# Patient Record
Sex: Male | Born: 1965 | Hispanic: Yes | Marital: Single | Smoking: Never smoker
Health system: Southern US, Community
[De-identification: ages and names within clinical notes are randomized; demographics above are authoritative.]

---

## 2010-03-20 ENCOUNTER — Emergency Department (HOSPITAL_COMMUNITY): Admission: EM | Admit: 2010-03-20 | Discharge: 2010-03-21 | Payer: Self-pay | Admitting: Emergency Medicine

## 2014-09-30 ENCOUNTER — Emergency Department (HOSPITAL_COMMUNITY)
Admission: EM | Admit: 2014-09-30 | Discharge: 2014-10-01 | Disposition: A | Discharge status: Home / Self Care | Payer: Worker's Compensation | Attending: Emergency Medicine | Admitting: Emergency Medicine

## 2014-09-30 ENCOUNTER — Encounter (HOSPITAL_COMMUNITY): Payer: Self-pay | Admitting: Emergency Medicine

## 2014-09-30 DIAGNOSIS — W260XXA Contact with knife, initial encounter: Secondary | ICD-10-CM | POA: Diagnosis not present

## 2014-09-30 DIAGNOSIS — Y998 Other external cause status: Secondary | ICD-10-CM | POA: Insufficient documentation

## 2014-09-30 DIAGNOSIS — Z880 Allergy status to penicillin: Secondary | ICD-10-CM | POA: Diagnosis not present

## 2014-09-30 DIAGNOSIS — Y929 Unspecified place or not applicable: Secondary | ICD-10-CM | POA: Insufficient documentation

## 2014-09-30 DIAGNOSIS — Y9389 Activity, other specified: Secondary | ICD-10-CM | POA: Insufficient documentation

## 2014-09-30 DIAGNOSIS — S61309A Unspecified open wound of unspecified finger with damage to nail, initial encounter: Secondary | ICD-10-CM

## 2014-09-30 DIAGNOSIS — S61223A Laceration with foreign body of left middle finger without damage to nail, initial encounter: Secondary | ICD-10-CM | POA: Diagnosis present

## 2014-09-30 DIAGNOSIS — S61209A Unspecified open wound of unspecified finger without damage to nail, initial encounter: Secondary | ICD-10-CM

## 2014-09-30 DIAGNOSIS — Z791 Long term (current) use of non-steroidal anti-inflammatories (NSAID): Secondary | ICD-10-CM | POA: Diagnosis not present

## 2014-10-01 MED ORDER — ACETAMINOPHEN 500 MG PO TABS
1000.0000 mg | ORAL_TABLET | Freq: Once | ORAL | Status: AC
  Administered 2014-10-01: 1000 mg via ORAL

## 2014-10-01 MED ORDER — TETANUS-DIPHTH-ACELL PERTUSSIS 5-2.5-18.5 LF-MCG/0.5 IM SUSP
0.5000 mL | Freq: Once | INTRAMUSCULAR | Status: AC
  Administered 2014-10-01: 0.5 mL via INTRAMUSCULAR

## 2014-10-01 NOTE — ED Provider Notes (Signed)
CSN: [id number]     Arrival date & time 09/30/14  2058 History   First MD Initiated Contact with Patient 10/01/14 [phone]     Chief Complaint  Patient presents with  . Laceration     (Consider location/radiation/quality/duration/timing/severity/associated sxs/prior Treatment) HPI Patient is a 48 year old male with no significant past medical history who presents the ER with a injury to his finger. Patient states he was cooking using a knife, and accidentally cut his middle finger on his left hand. Patient reports persistent bleeding, and mild pain. Patient denies numbness, tingling, loss of sensation, weakness.  History reviewed. No pertinent past medical history. History reviewed. No pertinent past surgical history. No family history on file. History  Substance Use Topics  . Smoking status: Not on file  . Smokeless tobacco: Not on file  . Alcohol Use: Not on file    Review of Systems  Skin: Positive for wound.  Neurological: Negative for numbness.      Allergies  Penicillins  Home Medications   Prior to Admission medications   Medication Sig Start Date End Date Taking? Authorizing Provider  naproxen sodium (ANAPROX) 220 MG tablet Take 220 mg by mouth 2 (two) times daily as needed (pain).   Yes Historical Provider, MD   BP 119/92 mmHg  Pulse 64  Temp(Src) 98.7 F (37.1 C) (Oral)  Resp 14  SpO2 99% Physical Exam  Constitutional: He is oriented to person, place, and time. He appears well-developed and well-nourished. No distress.  HENT:  Head: Normocephalic and atraumatic.  Eyes: Right eye exhibits no discharge. Left eye exhibits no discharge. No scleral icterus.  Neck: Normal range of motion.  Pulmonary/Chest: Effort normal. No respiratory distress.  Musculoskeletal: Normal range of motion.  Neurological: He is alert and oriented to person, place, and time.  Skin: Skin is warm and dry. He is not diaphoretic.  Avulsion noted to the distal, radial one fourth of  patient's nail on the middle finger of his left hand (please see picture). Nail matrix intact, lunula and nailbed at borders of the nail intact. Finger pad intact.  Psychiatric: He has a normal mood and affect.  Nursing note and vitals reviewed.   ED Course  Procedures (including critical care time) Labs Review Labs Reviewed - No data to display  Patient was cooking and he was using a knife and accidentally cut his left middle finger.  Patient has good range of motion of his finger. He has a total evulsion of part of his fingernail with active bleeding. Please see photo      Medical screening examination/treatment/procedure(s) were conducted as a shared visit with non-physician practitioner(s) and myself. I personally evaluated the patient during the encounter.   EKG Interpretation None     Devoria AlbeIva Knapp, MD, Armando GangFACEP   Ward GivensIva L Knapp, MD 10/01/14 0216  Imaging Review No results found.   EKG Interpretation None      MDM   Final diagnoses:  Avulsion of finger, initial encounter  Fingernail avulsion, initial encounter   Patient here with avulsion to nail and nailbed of middle finger of left hand. Avulsed tissue is off, and wound is not amenable to laceration or repair. Hemostatic dressing placed on wound, which slowed the bleeding of the avulsion. Wound was cleaned and dressed with hemostatic dressing. I discussed return precautions with patient and encouraged him to follow-up with a primary care physician. Tetanus booster given. I encouraged patient to call or return to the ER should he have any questions or concerns.  BP 119/92 mmHg  Pulse 64  Temp(Src) 98.7 F (37.1 C) (Oral)  Resp 14  SpO2 99%  Signed,  Ladona MowJoe Kendry Pfarr, PA-C 8:42 AM     Monte FantasiaJoseph W Lainey Nelson, PA-C 10/01/14 [id number]  Ward GivensIva L Knapp, MD 11/15/14 [phone]

## 2014-10-01 NOTE — Discharge Instructions (Signed)
Nail Avulsion Injury Nail avulsion means that you have lost the whole, or part of a nail. The nail will usually grow back in 2 to 6 months. If your injury damaged the growth center of the nail, the nail may be deformed, split, or not stuck to the nail bed. Sometimes the avulsed nail is stitched back in place. This provides temporary protection to the nail bed until the new nail grows in.  HOME CARE INSTRUCTIONS   Raise (elevate) your injury as much as possible.  Protect the injury and cover it with bandages (dressings) or splints as instructed.  Change dressings as instructed. SEEK MEDICAL CARE IF:   There is increasing pain, redness, or swelling.  You cannot move your fingers or toes. Document Released: 11/21/2004 Document Revised: 01/06/2012 Document Reviewed: 09/15/2009 [facility] Patient Information 2015 [place], [place]. This information is not intended to replace advice given to you by your health care provider. Make sure you discuss any questions you have with your health care provider.  Deep Skin Avulsion A deep skin avulsion is when all layers of the skin or parts of body structures have been torn away. This is usually a result of severe injury (trauma). A deep skin avulsion can include damage to important structures beneath the skin such as tendons, ligaments, nerves, or blood vessels.  CAUSES  Many injuries can lead to a deep skin avulsion. These include:   Crush injuries.  Bites.  Falls against jagged surfaces.  Gunshot wounds.  Severe burns and injuries involving dragging (such as those from a bicycle or motorcycle accident). TREATMENT   If the wound is small and there is no damage to vital structures like nerves and blood vessels, the damaged tissues may be removed. Then, the wound can be cleaned thoroughly and closed.  A skin graft may be performed. This is a procedure in which the outer layer of skin is removed from a different part of your body. That skin (skin graft)  is used to cover the open wound. This can happen after damaged tissue is removed and repairs are completed.  Your caregiver may onlyapply a bandage (dressing) to the wound. The wound will be kept clean and allowed to heal. Healing can take weeks or months and usually leaves a large scar. This type of treatment is only done if your caregiver feels that skin grafting or a similar procedure would not work. You might need a tetanus shot if:  You cannot remember when you had your last tetanus shot.  You have never had a tetanus shot.  The injury broke your skin. If you got a tetanus shot, your arm may swell, get red, and feel warm to the touch. This is common and not a problem. If you need a tetanus shot and you choose not to have one, there is a rare chance of getting tetanus. Sickness from tetanus can be serious. HOME CARE INSTRUCTIONS   Only take over-the-counter or prescription medicines for pain, discomfort, or fever as directed by your caregiver.  Gently wash the area with mild soap and water 2 times a day, or as directed. Rinse off the soap. Pat the area dry with a clean towel. Do not rub the wound. This may cause bleeding.  Follow your caregiver's instructions for how often you need to change the dressing.  Apply ointment and a dressing to the wound as directed.  If the dressing sticks, moisten it with soapy water and gently remove it.  Change the bandage right away if  it becomes wet, dirty, or starts to smell bad.  Take showers. Do not take tub baths, swim, or do anything that may soak the wound until it is healed.  Use anti-itch medicine as directed by your caregiver. The wound may itch when it is healing. Do not pick or scratch at the wound.  Follow up with your caregiver for stitches (sutures), staple, or skin adhesive strip removal. SEEK MEDICAL CARE IF:   You have redness, swelling, or increasing pain in your wound.  A red streak or line extends away from the wound.  You  have pus coming from the wound.  You notice a bad smell coming from thewound or dressing.  The wound breaks open (edges not staying together) after sutures have been removed.  You notice something coming out of the wound, such as a small piece of wood, glass, or metal.  You are unable to properly move a finger or toe if the wound is on your hand or foot.  You have severe swelling around the wound that causes pain and numbness.  Your arm, hand, leg, or foot changes color. SEEK IMMEDIATE MEDICAL CARE IF:   Your pain becomes severe or is not adequately relieved with pain medicine.  You have a fever.  You have nausea and vomiting for more than 24 hours.  You feel lightheaded, weak, or faint.  You develop chest pain or difficulty breathing. MAKE SURE YOU:   Understand these instructions.  Will watch your condition.  Will get help right away if you are not doing well or get worse. Document Released: 12/10/2006 Document Revised: 01/06/2012 Document Reviewed: 02/17/2011 [facility] Patient Information 2015 [place], [place]. This information is not intended to replace advice given to you by your health care provider. Make sure you discuss any questions you have with your health care provider.  Lesin de la ua por avulsin (Nail Avulsion Injury) Usted ha perdido una ua de la mano o del pie. Normalmente, la ua vuelve a crecer luego de 2 a 6 meses. Si el traumatismo ha daado [employer] de crecimiento de la ua, esta puede crecer deformada, partida o no adherirse al [employer] de la ua. En algunos casos, la ua se coloca nuevamente en su lugar por medio de puntos. Esto proporciona una proteccin temporaria al [employer] de la ua, hasta que crezca una nueva. INSTRUCCIONES PARA EL CUIDADO DOMICILIARIO  Mantenga la zona lesionada elevada todo el tiempo que pueda.  Protjala cubrindola con un vendaje o con una tablilla segn se le haya indicado.  Cambie el vendaje tal como se le indic. SOLICITE  ATENCIN MDICA SI:  Presenta enrojecimiento, hinchazn o aumento del dolor en la herida.  No puede mover los dedos. Document Released: 10/14/2005 Document Revised: 01/06/2012 [facility] Patient Information 2015 [place]. This information is not intended to replace advice given to you by your health care provider. Make sure you discuss any questions you have with your health care provider.  Avulsin profunda en la piel (Deep Skin Avulsion) Una avulsin profunda de la piel se produce cuando todas las capas de la piel o parte de las estructuras del cuerpo se rompen y Advertising account executive afuera. Generalmente es consecuencia de una lesin grave (traumatismo). Una avulsin profunda puede daar estructuras importantes que se encuentran por debajo de la piel, como tendones, ligamentos, nervios o vasos sanguneos.  CAUSAS Muchos traumatismos pueden producir una avulsin profunda en la piel. Entre ellos se incluyen:   Lesiones por [employer].  Cadas sobre superficies irregulares.  Heridas de arma.  Quemaduras y lesiones graves por arrastre. (como un accidente de bicicleta o motocicleta). TRATAMIENTO  En algunos casos, cuando la herida es pequea y no hay lesin en las estructuras vitales (como en los nervios y vasos sanguneos), los tejidos daados podrn extirparse. Entonces la herida puede limpiarse exhaustivamente y cerrarse como en el caso de una herida menos grave.  En muchos casos, luego de que el tejido daado es extirpado y Scientist, research (medical) se ha completado, podr realizarse un injerto. Un injerto de piel es un procedimiento en el cual el cirujano quita la capa externa de piel de una zona diferente del cuerpo y la [place] para cubrir la herida Set designer. Esto puede ocurrir despus de que el tejido daado se extirpe y se complete la reparacin.  El mdico slo podr aplicar un (vendaje) en la herida. La herida se mantendr limpia lo cual llevar a la curacin. La curacin puede demorar un  largo tiempo (semanas o meses) y generalmente deja una gran Radio broadcast assistant. Este tipo de tratamiento se indica slo si el mdico considera que un injerto o procedimiento similar no funcionar. Deber aplicarse la vacuna contra el ttanos si:   No recuerda cundo se coloc la vacuna la ltima vez.  Nunca recibi esta vacuna.  La lesin ha [employer]. Si le han aplicado la vacuna contra el ttanos, el brazo podr hincharse, enrojecer y sentirse caliente al tacto. Esto es frecuente y no es un problema. Si usted necesita aplicarse la vacuna y se niega a recibirla, corre riesgo de contraer ttanos. sta es una enfermedad grave.  INSTRUCCIONES PARA EL CUIDADO DOMICILIARIO  Slo tome medicamentos de venta libre o prescriptos para Glass blower/designer, las molestias, o bajar la fiebre segn las indicaciones de su mdico.  Lave la zona suavemente la zona afectada dos veces por da con agua y [place]. Enjuague bien el jabn. Seque dando palmaditas con un pao limpio y seco. No frote la herida. Puede producirle una hemorragia.  Siga las indicaciones del mdico para saber con qu frecuencia debe cambiarse el vendaje.  Aplique un ungento y un vendaje sobre la herida segn las indicaciones.  Si el vendaje se pega, humedzcalo con [place] y retrelo suavemente.  Si el vendaje se moja, se ensucia o presenta un olor ftido, cmbielo tan pronto como pueda.  Puede tomar [place]. No tome baos de inmersin, no practique natacin, no haga nada que pueda humedecer la herida hasta que se cure.  Use un antihistamnico para evitar la picazn segn las indicaciones del mdico. La herida puede picar cuando se cura. No se toque ni se rasque la herida.  Concurra a los controles con el mdico para que le retire los puntos (suturas), las grapas o las tiras [place] de la piel. SOLICITE ATENCIN MDICA SI:  Observa enrojecimiento, hinchazn o aumenta el dolor en la herida.  Observa una lnea roja que se extiende  [place] o debajo de la herida o del lugar en el que se sac piel.  Aparece pus en la herida.  Advierte un olor ftido que proviene de la herida o del vendaje.  La herida se abre (los bordes no estn unidos) luego de la remocin de las suturas.  Nota que un cuerpo extrao se asoma por la herida, como un pequeo trozo de [place], vidrio o metal.  No puede mover un dedo, si la herida es en la mano o el pie.  Hay una zona muy hinchada alrededor de la herida [employer] causa  dolor y adormecimiento.  El brazo, la [place], la pierna o el pie cambian de color. SOLICITE ATENCIN MDICA INMEDIATAMENTE SI:  El dolor no se [place] o se agrava, an recibiendo analgsicos.  Tiene fiebre.  Tuvo nuseas o ha vomitado durante ms de 24 horas.  Siente mareos, una debilidad inusual o siente que va a desmayarse.  Tiene dolor en el pecho o dificultad para respirar. ASEGRESE QUE:   Comprende estas instrucciones.  Controlar su enfermedad.  Solicitar ayuda de inmediato si no mejora o si empeora. Document Released: 01/30/2009 Document Revised: 01/06/2012 [facility] Patient Information 2015 [place]. This information is not intended to replace advice given to you by your health care provider. Make sure you discuss any questions you have with your health care provider.

## 2014-10-01 NOTE — ED Notes (Signed)
Patient was cooking and he was using a knife and accidentally cut his left middle finger.  Patient has good range of motion of his finger. He has a total evulsion of part of his fingernail with active bleeding. Please see photo      Medical screening examination/treatment/procedure(s) were conducted as a shared visit with non-physician practitioner(s) and myself.  I personally evaluated the patient during the encounter.   EKG Interpretation None       Devoria AlbeIva Lashaun Poch, MD, Armando GangFACEP   Ward GivensIva L Meegan Shanafelt, MD 10/01/14 [phone]

## 2015-04-03 ENCOUNTER — Ambulatory Visit (INDEPENDENT_AMBULATORY_CARE_PROVIDER_SITE_OTHER): Payer: Self-pay | Admitting: Family Medicine

## 2015-04-03 VITALS — BP 118/74 | HR 71 | Temp 97.8°F | Resp 17 | Ht 66.0 in | Wt 160.0 lb

## 2015-04-03 DIAGNOSIS — N400 Enlarged prostate without lower urinary tract symptoms: Secondary | ICD-10-CM

## 2015-04-03 DIAGNOSIS — R8290 Unspecified abnormal findings in urine: Secondary | ICD-10-CM

## 2015-04-03 DIAGNOSIS — R102 Pelvic and perineal pain: Secondary | ICD-10-CM

## 2015-04-03 LAB — POCT CBC
Granulocyte percent: 76 %G (ref 37–80)
HEMATOCRIT: 49 % (ref 43.5–53.7)
Hemoglobin: 16.3 g/dL (ref 14.1–18.1)
LYMPH, POC: 2.1 (ref 0.6–3.4)
MCH, POC: 29.7 pg (ref 27–31.2)
MCHC: 33.2 g/dL (ref 31.8–35.4)
MCV: 89.5 fL (ref 80–97)
MID (CBC): 0.4 (ref 0–0.9)
MPV: 7.1 fL (ref 0–99.8)
POC Granulocyte: 8.1 — AB (ref 2–6.9)
POC LYMPH PERCENT: 20.2 %L (ref 10–50)
POC MID %: 3.8 % (ref 0–12)
Platelet Count, POC: 249 10*3/uL (ref 142–424)
RBC: 5.47 M/uL (ref 4.69–6.13)
RDW, POC: 13.1 %
WBC: 10.6 10*3/uL — AB (ref 4.6–10.2)

## 2015-04-03 LAB — POCT UA - MICROSCOPIC ONLY
Bacteria, U Microscopic: NEGATIVE
Casts, Ur, LPF, POC: NEGATIVE
Crystals, Ur, HPF, POC: NEGATIVE
MUCUS UA: NEGATIVE
RBC, URINE, MICROSCOPIC: NEGATIVE
Yeast, UA: NEGATIVE

## 2015-04-03 LAB — POCT URINALYSIS DIPSTICK
Bilirubin, UA: NEGATIVE
GLUCOSE UA: NEGATIVE
Leukocytes, UA: NEGATIVE
Nitrite, UA: NEGATIVE
SPEC GRAV UA: 1.025
UROBILINOGEN UA: 0.2
pH, UA: 5.5

## 2015-04-03 MED ORDER — DOXYCYCLINE HYCLATE 100 MG PO CAPS: 100.0000 mg | ORAL_CAPSULE | Freq: Two times a day (BID) | ORAL | Status: DC

## 2015-04-03 MED ORDER — TAMSULOSIN HCL 0.4 MG PO CAPS: 0.4000 mg | ORAL_CAPSULE | Freq: Every day | ORAL | Status: DC

## 2015-04-03 MED ORDER — CEFIXIME 400 MG PO TABS: 400.0000 mg | ORAL_TABLET | Freq: Every day | ORAL | Status: DC

## 2015-04-03 NOTE — Patient Instructions (Addendum)
Prostatitis ( Prostatitis) La prstata tiene aproximadamente el tamao y la forma de una nuez. Se ubica justo debajo de la vejiga. Produce uno de los componentes del semen, formado por los espermatozoides y los lquidos que ayudan a nutrirlos y transportarlos fuera de los testculos. La prostatitis es una inflamacin de la prstata.  Hay cuatro tipos de prostatitis:  Prostatitis bacteriana aguda. Es el tipo menos frecuente de prostatitis. Comienza rpidamente y, por lo general, est acompaada por una infeccin de la vejiga, fiebre alta y escalofros. Puede ocurrir a cualquier edad.  Prostatitis bacteriana crnica. Es una infeccin bacteriana persistente en la prstata. Generalmente aparece luego de una prostatitis bacteriana aguda que se repite o que no fue tratada adecuadamente. Se puede presentar en hombres de cualquier edad pero es ms comn en los hombres de mediana edad cuya prstata ha comenzado a agrandarse. Los sntomas no son tan graves como los de la prostatitis bacteriana aguda. La principal molestia puede ser en la parte del cuerpo que est delante del recto y debajo del escroto (perineo), en la parte baja del abdomen o en la punta del pene (glande).   Prostatitis crnica (no bacteriana). Es el tipo ms frecuente de prostatitis. Es la inflamacin de la prstata y no se origina por una infeccin bacteriana. Se desconoce su causa y puede estar asociada con infecciones virales o trastornos autoinmunitarios.  Prostatodinia (trastorno del suelo plvico). Est asociada con un aumento del tono muscular en la pelvis que rodea a la prstata. CAUSAS La causa de la prostatitis bacteriana es una infeccin bacteriana. Se desconocen las causas de los otros tipos de prostatitis.  SNTOMAS  Los sntomas pueden variar segn el tipo de prostatitis. Tambin puede ocurrir que coincidan sntomas de diferentes tipos de prostatitis. Los sntomas posibles de cada tipo de prostatitis se enumeran a  continuacin. Prostatitis bacteriana aguda  Dolor al orinar.  Fiebre o escalofros.  Dolor muscular o en las articulaciones.  Dolor lumbar.  Dolor en la zona inferior del abdomen.  Imposibilidad de vaciar la vejiga completamente. Prostatitis bacteriana crnica, prostatitis crnica no bacteriana y prostatodinia  Necesidad urgente y repentina de orinar.  Ganas de orinar con frecuencia.  Dificultad para comenzar a eliminar la orina.  Chorro de orina dbil.  Secrecin por la uretra.  Goteo al terminar de orinar.  Dolor rectal.  Dolor en los testculos, el pene o la punta del pene.  Dolor en el perineo.  Problemas con la funcin sexual.  Eyaculacin dolorosa.  Semen con sangre. DIAGNSTICO  Su mdico le preguntar acerca de sus sntomas para hacer el diagnstico de prostatitis. Se recolectarn y analizarn una o ms muestras de orina (anlisis de orina). Si el resultado del anlisis de orina es negativo para bacterias, su mdico podr palpar su prstata con un dedo (examen dgito rectal). Este examen ayuda a su mdico a determinar si la prstata est inflamada y sensible. Tambin se obtendr una muestra de semen para analizarla. TRATAMIENTO  El tratamiento de la prostatitis depende de la causa. Si la causa es una infeccin bacteriana, se puede tratar con antibiticos. En los casos de prostatitis bacteriana crnica, puede ser necesario el uso de antibiticos durante hasta 1mes o 6semanas. Su mdico puede indicarle que tome baos de asiento para ayudar a aliviar el dolor. Un bao de asiento es un bao de agua caliente en la que las caderas y las nalgas estn bajo el agua. Esto relaja los msculos del suelo plvico y, a menudo, contribuye a aliviar la presin en la   prstata. INSTRUCCIONES PARA EL CUIDADO EN EL HOGAR   Tome todos los medicamentos como le indic el mdico.  Tome baos de asiento segn las indicaciones del mdico. SOLICITE ATENCIN MDICA SI:   Los sntomas  empeoran en lugar de mejorar.  Tiene fiebre. SOLICITE ATENCIN MDICA DE INMEDIATO SI:   Tiene escalofros.  Siente nuseas o vomita.  Se siente mareado o se desmaya.  No puede orinar.  Tiene sangre o cogulos de sangre en la orina. ASEGRESE DE QUE:  Comprende estas instrucciones.  Controlar su afeccin.  Recibir ayuda de inmediato si no mejora o si empeora. Document Released: 07/24/2005 Document Revised: 10/19/2013 [facility] Patient Information 2015 [place], [place]. This information is not intended to replace advice given to you by your health care provider. Make sure you discuss any questions you have with your health care provider.

## 2015-04-03 NOTE — Progress Notes (Addendum)
Subjective:    Patient ID: Wayne Michael, male    DOB: [birth year], 49 y.o.   MRN: [id number] This chart was scribed for Sherren Mocha, MD by [place] Peace, ED Scribe. The patient was seen in [facility]. The patient's care was started at 4:44 PM.  Chief Complaint  Patient presents with  . pain with urination redness and odor  . Leg Pain     Leg Pain    Language interpreter used- Spanish  HPI Comments: Wayne Michael is a 49 y.o. male who presents to the [facility] complaining of lower abdominal pain and dysuria x 3 weeks that has gotten progressively worse. He also complains of lower back pain and diarrhea. No complaints of nausea, vomiting, or constipation.   No past medical history on file. No past surgical history on file. Allergies  Allergen Reactions  . Penicillins Rash   Current Outpatient Prescriptions on File Prior to Visit  Medication Sig Dispense Refill  . naproxen sodium (ANAPROX) 220 MG tablet Take 220 mg by mouth 2 (two) times daily as needed (pain).     No current facility-administered medications on file prior to visit.     Review of Systems  Constitutional: Negative for fever.  Gastrointestinal: Positive for abdominal pain (lower) and diarrhea. Negative for nausea, vomiting and constipation.  Genitourinary: Positive for dysuria.  Musculoskeletal: Positive for back pain.  Skin: Negative for rash.       Objective:   Physical Exam  Constitutional: He is oriented to person, place, and time. He appears well-developed and well-nourished. No distress.  HENT:  Head: Normocephalic and atraumatic.  Eyes: Conjunctivae and EOM are normal.  Neck: Neck supple. No tracheal deviation present.  Cardiovascular: Normal rate, regular rhythm and normal heart sounds.   No murmur heard. Pulmonary/Chest: Effort normal and breath sounds normal. No respiratory distress. He has no wheezes.  Abdominal: Soft. He exhibits no distension and no mass. Bowel sounds are  increased. There is no hepatosplenomegaly. There is no tenderness. There is no rebound and no guarding.  Hypoactive bowel sounds  Genitourinary:  Prostate- Severely tender, large, and boggy.   Musculoskeletal: Normal range of motion.  Lymphadenopathy:    He has no cervical adenopathy.  Neurological: He is alert and oriented to person, place, and time.  Skin: Skin is warm and dry.  Psychiatric: He has a normal mood and affect. His behavior is normal.  Nursing note and vitals reviewed.    Filed Vitals:   04/03/15 1453  BP: 118/74  Pulse: 71  Temp: 97.8 F (36.6 C)  Resp: 17    4:54 PM- Treatment plan was discussed with patient who verbalizes understanding and agrees.   Results for orders placed or performed in visit on 04/03/15  POCT UA - Microscopic Only  Result Value Ref Range   WBC, Ur, HPF, POC 0-1    RBC, urine, microscopic negative    Bacteria, U Microscopic negative    Mucus, UA negative    Epithelial cells, urine per micros 0-1    Crystals, Ur, HPF, POC negative    Casts, Ur, LPF, POC negative    Yeast, UA negative   POCT urinalysis dipstick  Result Value Ref Range   Color, UA yellow    Clarity, UA clear    Glucose, UA negative    Bilirubin, UA negative    Ketones, UA trace    Spec Grav, UA 1.025    Blood, UA trace-lysed    pH, UA 5.5    Protein,  UA trace    Urobilinogen, UA 0.2    Nitrite, UA negative    Leukocytes, UA Negative   POCT CBC  Result Value Ref Range   WBC 10.6 (A) 4.6 - 10.2 K/uL   Lymph, poc 2.1 0.6 - 3.4   POC LYMPH PERCENT 20.2 10 - 50 %L   MID (cbc) 0.4 0 - 0.9   POC MID % 3.8 0 - 12 %M   POC Granulocyte 8.1 (A) 2 - 6.9   Granulocyte percent 76.0 37 - 80 %G   RBC 5.47 4.69 - 6.13 M/uL   Hemoglobin 16.3 14.1 - 18.1 g/dL   HCT, POC 86.549.0 78.443.5 - 53.7 %   MCV 89.5 80 - 97 fL   MCH, POC 29.7 27 - 31.2 pg   MCHC 33.2 31.8 - 35.4 g/dL   RDW, POC 69.613.1 %   Platelet Count, POC 249 142 - 424 K/uL   MPV 7.1 0 - 99.8 fL       Assessment  & Plan:   1. Pelvic pain in male   2. Urine abnormality   3. Prostatism     Orders Placed This Encounter  Procedures  . POCT UA - Microscopic Only  . POCT urinalysis dipstick  . POCT CBC    Meds ordered this encounter  Medications  . doxycycline (VIBRAMYCIN) 100 MG capsule    Sig: Take 1 capsule (100 mg total) by mouth 2 (two) times daily.    Dispense:  20 capsule    Refill:  0  . cefixime (SUPRAX) 400 MG tablet    Sig: Take 1 tablet (400 mg total) by mouth daily.    Dispense:  1 tablet    Refill:  0  . tamsulosin (FLOMAX) 0.4 MG CAPS capsule    Sig: Take 1 capsule (0.4 mg total) by mouth daily.    Dispense:  30 capsule    Refill:  3    I personally performed the services described in this documentation, which was scribed in my presence. The recorded information has been reviewed and considered, and addended by me as needed.  Norberto SorensonEva Sian Joles, MD MPH

## 2015-05-16 ENCOUNTER — Ambulatory Visit (INDEPENDENT_AMBULATORY_CARE_PROVIDER_SITE_OTHER): Payer: Self-pay | Admitting: Urgent Care

## 2015-05-16 VITALS — BP 120/76 | HR 84 | Temp 98.0°F | Resp 18 | Ht 66.5 in | Wt 163.0 lb

## 2015-05-16 DIAGNOSIS — R102 Pelvic and perineal pain: Secondary | ICD-10-CM | POA: Insufficient documentation

## 2015-05-16 DIAGNOSIS — N200 Calculus of kidney: Secondary | ICD-10-CM

## 2015-05-16 DIAGNOSIS — R319 Hematuria, unspecified: Secondary | ICD-10-CM

## 2015-05-16 DIAGNOSIS — R300 Dysuria: Secondary | ICD-10-CM

## 2015-05-16 DIAGNOSIS — Z7251 High risk heterosexual behavior: Secondary | ICD-10-CM | POA: Insufficient documentation

## 2015-05-16 DIAGNOSIS — N419 Inflammatory disease of prostate, unspecified: Secondary | ICD-10-CM

## 2015-05-16 LAB — POCT CBC
Granulocyte percent: 74.4 %G (ref 37–80)
HEMATOCRIT: 47.7 % (ref 43.5–53.7)
HEMOGLOBIN: 16 g/dL (ref 14.1–18.1)
LYMPH, POC: 1.5 (ref 0.6–3.4)
MCH, POC: 29.6 pg (ref 27–31.2)
MCHC: 33.6 g/dL (ref 31.8–35.4)
MCV: 87.9 fL (ref 80–97)
MID (cbc): 0.3 (ref 0–0.9)
MPV: 7.1 fL (ref 0–99.8)
POC GRANULOCYTE: 5.4 (ref 2–6.9)
POC LYMPH PERCENT: 20.8 %L (ref 10–50)
POC MID %: 4.8 %M (ref 0–12)
Platelet Count, POC: 244 10*3/uL (ref 142–424)
RBC: 5.42 M/uL (ref 4.69–6.13)
RDW, POC: 12.4 %
WBC: 7.2 10*3/uL (ref 4.6–10.2)

## 2015-05-16 LAB — POCT UA - MICROSCOPIC ONLY
Bacteria, U Microscopic: NEGATIVE
Casts, Ur, LPF, POC: NEGATIVE
Crystals, Ur, HPF, POC: NEGATIVE
Epithelial cells, urine per micros: NEGATIVE
MUCUS UA: NEGATIVE
RBC, URINE, MICROSCOPIC: NEGATIVE
Yeast, UA: NEGATIVE

## 2015-05-16 LAB — POCT URINALYSIS DIPSTICK
BILIRUBIN UA: NEGATIVE
Glucose, UA: NEGATIVE
KETONES UA: NEGATIVE
LEUKOCYTES UA: NEGATIVE
NITRITE UA: NEGATIVE
PROTEIN UA: NEGATIVE
Spec Grav, UA: 1.025
Urobilinogen, UA: 0.2
pH, UA: 5.5

## 2015-05-16 LAB — POCT GLYCOSYLATED HEMOGLOBIN (HGB A1C): Hemoglobin A1C: 5.8

## 2015-05-16 LAB — HIV ANTIBODY (ROUTINE TESTING W REFLEX): HIV 1&2 Ab, 4th Generation: NONREACTIVE

## 2015-05-16 MED ORDER — CIPROFLOXACIN HCL 500 MG PO TABS: 500.0000 mg | ORAL_TABLET | Freq: Two times a day (BID) | ORAL | Status: DC

## 2015-05-16 NOTE — Progress Notes (Signed)
MRN: [id number] DOB: 12-16-1965  Subjective:   Wayne Michael is a 49 y.o. male presenting for follow up on prostatitis. He was seen here on 04/03/2015, treated with cefixime, doxycycline, Flomax. He completed course of antibiotics but his symptoms persist. Today, he reports ~2 month history of lower pelvic pain, dysuria, hematuria, solid material in his urine worse in the morning. Also reports intermittent low back pain. Urinates 4-5 times per day, drinks plenty of water daily. Denies fever, nausea, vomiting, abdominal pain, urinary urgency, decreased urine output, weekend urinary flow, perirectal pain, genital rashes, flank pain. Denies any other aggravating or relieving factors, no other questions or concerns.  Wayne Michael has a current medication list which includes the following prescription(s): cefixime, doxycycline, and tamsulosin. He is allergic to penicillins.  Wayne Michael  has no past medical history on file. Also  has no past surgical history on file.  ROS As in subjective.  Objective:   Vitals: BP 120/76 mmHg  Pulse 84  Temp(Src) 98 F (36.7 C) (Oral)  Resp 18  Ht 5' 6.5" (1.689 m)  Wt 163 lb (73.936 kg)  BMI 25.92 kg/m2  SpO2 98%  Physical Exam  Constitutional: He is oriented to person, place, and time. He appears well-developed and well-nourished.  Cardiovascular: Normal rate, regular rhythm and intact distal pulses.  Exam reveals no gallop and no friction rub.   No murmur heard. Pulmonary/Chest: No respiratory distress. He has no wheezes. He has no rales.  Abdominal: Soft. Bowel sounds are normal. He exhibits no distension and no mass. There is no tenderness (denies pain but states that he feels "pressure" over mid pelvic area).  No CVA tenderness.  Neurological: He is alert and oriented to person, place, and time.  Skin: Skin is warm and dry. No rash noted. No erythema. No pallor.   Results for orders placed or performed in visit on 05/16/15 (from the past 24  hour(s))  POCT urinalysis dipstick     Status: None   Collection Time: 05/16/15  2:07 PM  Result Value Ref Range   Color, UA yellow    Clarity, UA clear    Glucose, UA neg    Bilirubin, UA neg    Ketones, UA neg    Spec Grav, UA 1.025    Blood, UA trace-lysed    pH, UA 5.5    Protein, UA neg    Urobilinogen, UA 0.2    Nitrite, UA neg    Leukocytes, UA Negative Negative  POCT UA - Microscopic Only     Status: None   Collection Time: 05/16/15  2:07 PM  Result Value Ref Range   WBC, Ur, HPF, POC 1-3    RBC, urine, microscopic neg    Bacteria, U Microscopic neg    Mucus, UA neg    Epithelial cells, urine per micros neg    Crystals, Ur, HPF, POC neg    Casts, Ur, LPF, POC neg    Yeast, UA neg   POCT CBC     Status: None   Collection Time: 05/16/15  2:07 PM  Result Value Ref Range   WBC 7.2 4.6 - 10.2 K/uL   Lymph, poc 1.5 0.6 - 3.4   POC LYMPH PERCENT 20.8 10 - 50 %L   MID (cbc) 0.3 0 - 0.9   POC MID % 4.8 0 - 12 %M   POC Granulocyte 5.4 2 - 6.9   Granulocyte percent 74.4 37 - 80 %G   RBC 5.42 4.69 - 6.13 M/uL  Hemoglobin 16.0 14.1 - 18.1 g/dL   HCT, POC 16.147.7 09.643.5 - 53.7 %   MCV 87.9 80 - 97 fL   MCH, POC 29.6 27 - 31.2 pg   MCHC 33.6 31.8 - 35.4 g/dL   RDW, POC 04.512.4 %   Platelet Count, POC 244 142 - 424 K/uL   MPV 7.1 0 - 99.8 fL  POCT glycosylated hemoglobin (Hb A1C)     Status: None   Collection Time: 05/16/15  2:48 PM  Result Value Ref Range   Hemoglobin A1C 5.8    Assessment and Plan :   1. Dysuria 2. Hematuria 3. Pelvic pain in male 4. High risk sexual behavior 5. Prostatitis, unspecified prostatitis type - After mostly normal UA, patient admits that he has had unprotected sex with more than one woman. He has not been tested for STI's. I ordered STI tests, he is to start ciprofloxacin x14 days for prostatitis likely due to an STI. He has undergone course of doxy which covers him for chlamydia. Labs are pending.  Wayne BambergMario Rossy Virag, PA-C [facility] [phone] 05/16/2015 1:34 PM

## 2015-05-16 NOTE — Progress Notes (Signed)
  Medical screening examination/treatment/procedure(s) were performed by non-physician practitioner and as supervising physician I was immediately available for consultation/collaboration.     

## 2015-05-16 NOTE — Patient Instructions (Addendum)
Enfermedades de transmisin sexual (Sexually Transmitted Disease) Una enfermedad de transmisin sexual (ETS) es toda infeccin que se contagia (transmite) de Neomia Dearuna persona a otra durante la actividad sexual. Puede transmitirse a travs de la saliva, el semen, la [place]angre, el moco vaginal o la [place]. Las enfermedades de transmisin sexual ms frecuentes son:   Bettey MareGonorrea.  Clamidia.  Sfilis.  VIH y DumbartonSIDA.  Herpes genital.  Hepatitis B y C.  Tricomonas.  Virus del papiloma humano (VPH).  [place].  Escabiosis.  Sarna.  Vaginosis bacteriana. CULES SON LAS CAUSAS DE LAS ETS? Una ETS se transmite por bacterias, virus o parsitos. Las ETS se contagian durante la actividad sexual si una de las personas est infectada. Sin embargo, tambin puede trasmitirse por medios no sexuales. Las ETS se trasmiten despus de:   Tener contacto sexual con un compaero infectado.  Compartir juguetes sexuales.  Compartir agujas con una persona infectada o intercambiar piercings o agujas de tatuajes.  Tener un contacto ntimo con los genitales, la boca, o la zona anal de una persona infectada.  Exposicin a los fluidos infectados durante el nacimiento. CULES SON LOS SIGNOS Y SNTOMAS DE UNA INFECCIN POR ETS? Las distintas enfermedades de transmisin sexual tienen diferentes sntomas. Algunas personas no tienen sntomas. Si se presentan sntomas, estos pueden ser:   Miccin dolorosa o con sangre.  Dolor en la pelvis, el abdomen, la vagina, el ano, la garganta o los ojos.  Erupcin cutnea, picazn o irritacin.  Bultos, lceras, ampollas o llagas en la zona genital o anal.  Flujo vaginal anormal con o sin mal olor.  En los hombres, secrecin peniana.  Grant RutsFiebre.  Dolor o Physiological scientisthemorragias durante las relaciones sexuales.  Inflamacin de los ganglios en la zona de la ingle.  Piel y ojos amarillos (ictericia). Esto se observa con la hepatitis.  Hinchazn de los  testculos.  Infertilidad.  Llagas y ampollas en la boca. CMO SE DIAGNOSTICAN LAS ETS? Para realizar un diagnstico, el mdico:   Tax inspectorealizar una historia clnica.  Realizar un examen fsico.  Drue Dunomar una muestra de cualquier secrecin que presente para analizarla.  Tomar una muestra de la garganta, el cuello del tero, la abertura del pene, el recto o la vagina para el Houstonestudio.  Analizar una muestra de la primera orina de la Cameronmaana.  Le pedir DIRECTVanlisis de sangre.  Si corresponde, le tomar la Hunkermuestra para un Papanicolau.  Har una colposcopa.  Har una laparoscopa. CMO SE TRATAN LAS ETS? El tratamiento depende de cul sea la ETS. Algunas ETS pueden tratarse pero no tienen Arubacura.   Clamidia, gonorrea, tricomonas y sfilis pueden curarse con antibiticos.  El herpes genital, la hepatitis y el VIH pueden tratarse, pero no curarse, con medicamentos recetados. Los USG Corporationmedicamentos aliviarn los sntomas.  Las Physiological scientistverrugas genitales en el VPH se extirpan utilizando medicamentos o mediante el congelamiento, el quemado (Automotive engineerelectrocauterizacin) o la Leisure centre managerciruga. Las IT consultantverrugas pueden reaparecer.  El VPH no puede curarse con medicamentos o Azerbaijanciruga. Sin embargo pueden extirparse zonas anormales del cuello del tero, la vagina o la vulva.  Si el diagnstico se confirma, sus compaeros sexuales ms recientes necesitan recibir tratamiento. Deben realizarlo aunque no tengan problemas o sus cultivos o evaluaciones sean negativos. No deben tener relaciones sexuales hasta que sus mdicos los autoricen. CMO PUEDO REDUCIR EL RIESGO DE TENER UNA ETS? Estas acciones le ayudarn a reducir el riesgo de contraer una ETS:  Use condones de ltex, protectores bucales y lubricantes solubles en agua durante la actividad sexual.No utilice vaselina ni aceites.  Evite tener mltiples compaeros sexuales.  No tenga relaciones sexuales con alguien que tiene otros [employer].  No tenga relaciones sexuales  con quien no conozca o con quien tenga riesgo de sufrir una ETS.  Evite las prcticas sexuales riesgosas que puedan lacerar la piel.  No tenga relaciones sexuales si tiene llagas abiertas en la boca o piel.  Evite abusar del alcohol o consumir drogas ilegales. El consumo de drogas o alcohol, puede afectar su capacidad de juicio y colocarlo en una posicin vulnerable.  Evite el sexo oral o anal.  Aplquese las vacunas para el VPH y la hepatitis. Si no ha recibido Scientist, product/process development, consulte a su mdico si debe aplicarse una o ambas.  Si tiene riesgo de infectarse por el VIH, se recomienda tomar diariamente un medicamento recetado para evitar la infeccin. Esto se conoce como profilaxis previa a la exposicin. Se considera que est en riesgo si:  Es un hombre que tiene sexo con otros hombres.  Es heterosexual y es activo sexualmente con ms de una pareja.  Se inyecta drogas.  Es [place] sexualmente con Neomia Dear pareja que tiene VIH.  Consulte a su mdico para saber si tiene un alto riesgo de infectarse por el VIH. Si opta por comenzar la profilaxis previa a la exposicin, primero debe realizarse anlisis de deteccin del VIH. Luego, le harn anlisis cada mientras est tomando los medicamentos para la profilaxis previa a la exposicin. QU DEBO HACER SI CREO QUE TENGO UNA ETS?  Consulte a su mdico.  Hable con su pareja sexual. Ellos deben ser evaluados y recibir tratamiento.  No tenga relaciones sexuales hasta que el mdico lo autorice. CUNDO DEBO BUSCAR ASISTENCIA MDICA INMEDIATA? Comunquese con su mdico de inmediato si:   Siente un dolor abdominal intenso.  Usted es hombre y nota hinchazn o dolor en los testculos.  Usted es mujer y nota hinchazn o dolor en la vagina. Document Released: 07/24/2005 Document Revised: 10/19/2013 [facility] Patient Information 2015 [place], [place]. This information is not intended to replace advice given to you by your health care provider.  Make sure you discuss any questions you have with your health care provider.    Prostatitis ( Prostatitis) La prstata tiene aproximadamente el tamao y la forma de Sharee Pimple. Se ubica justo debajo de la vejiga. Produce uno de los componentes del semen, formado por los espermatozoides y los lquidos que ayudan a nutrirlos y transportarlos fuera de los testculos. La prostatitis es una inflamacin de la prstata.  Hay cuatro tipos de prostatitis:  Prostatitis bacteriana aguda. Es el tipo menos frecuente de prostatitis. Comienza rpidamente y, por lo general, est acompaada por una infeccin de la vejiga, fiebre alta y escalofros. Puede ocurrir a Actuary.  Prostatitis bacteriana crnica. Es una infeccin bacteriana persistente en la prstata. Generalmente aparece luego de una prostatitis bacteriana aguda que se repite o que no fue tratada adecuadamente. Se puede presentar en hombres de cualquier edad pero es ms comn en los hombres de mediana edad cuya prstata ha comenzado a Government social research officer. Los sntomas no son tan graves como los de la prostatitis [place]. La principal [employer] ser en la parte del cuerpo que est delante del recto y debajo del escroto (perineo), en la parte baja del abdomen o en la punta del pene (glande).   Prostatitis crnica (no bacteriana). Es el tipo ms frecuente de prostatitis. Es la inflamacin de la prstata y no se origina por una infeccin bacteriana. Se desconoce su causa y [address]  asociada con infecciones virales o trastornos autoinmunitarios.  Prostatodinia (trastorno del suelo plvico). Est asociada con un aumento del tono muscular en la pelvis que rodea a la prstata. CAUSAS La causa de la prostatitis bacteriana es una infeccin bacteriana. Se desconocen las causas de los otros tipos de prostatitis.  SNTOMAS  Los sntomas pueden variar segn el tipo de prostatitis. Tambin puede ocurrir que coincidan sntomas de diferentes tipos de  prostatitis. Los sntomas posibles de cada tipo de prostatitis se enumeran a continuacin. Prostatitis bacteriana aguda  Dolor al [employer].  Fiebre o escalofros.  Dolor muscular o en las articulaciones.  Dolor lumbar.  Dolor en la zona inferior del abdomen.  Imposibilidad de vaciar la vejiga completamente. Prostatitis bacteriana crnica, prostatitis crnica no bacteriana y prostatodinia  [place] urgente y repentina de Geographical information systems officer.  Ganas de orinar con frecuencia.  Dificultad para comenzar a eliminar la orina.  Chorro de orina dbil.  Secrecin por Engineer, mining.  Goteo al terminar de Geographical information systems officer.  Dolor rectal.  Dolor en los testculos, el pene o la punta del pene.  Dolor en el perineo.  Problemas con la funcin sexual.  Eyaculacin dolorosa.  Semen con sangre. DIAGNSTICO  Su mdico le preguntar acerca de sus sntomas para hacer el diagnstico de prostatitis. Se recolectarn y analizarn una o ms muestras de [place] (anlisis de [place]). Si el resultado del anlisis de [place] es negativo para bacterias, su mdico podr palpar su prstata con un dedo (examen dgito rectal). Este examen ayuda a su mdico a determinar si la prstata est inflamada y sensible. Tambin se obtendr Lauris Poag de semen para analizarla. TRATAMIENTO  El tratamiento de la prostatitis depende de la causa. Si la causa es una infeccin [place], se puede tratar con antibiticos. En los casos de prostatitis bacteriana crnica, puede ser necesario el uso de antibiticos durante hasta o 6semanas. Su mdico puede indicarle que tome baos de asiento para ayudar a Engineer, materials. Un bao de asiento es un bao de agua caliente en la que las caderas y las nalgas estn bajo el agua. Esto relaja los msculos del suelo plvico y, a menudo, contribuye a Technical sales engineer presin en la prstata. INSTRUCCIONES PARA EL CUIDADO EN EL HOGAR   Tome todos los medicamentos como le indic el mdico.  Tome baos de asiento segn  las indicaciones del mdico. SOLICITE ATENCIN MDICA SI:   Los sntomas empeoran en lugar de mejorar.  Tiene fiebre. SOLICITE ATENCIN MDICA DE INMEDIATO SI:   Tiene escalofros.  Siente nuseas o vomita.  Se siente mareado o se desmaya.  No puede orinar.  Tiene sangre o cogulos de sangre en la orina. ASEGRESE DE QUE:  Comprende estas instrucciones.  Controlar su afeccin.  Recibir ayuda de inmediato si no mejora o si empeora. Document Released: 07/24/2005 Document Revised: 10/19/2013 [facility] Patient Information 2015 [place], [place]. This information is not intended to replace advice given to you by your health care provider. Make sure you discuss any questions you have with your health care provider.

## 2015-05-17 LAB — RPR

## 2015-05-17 NOTE — Addendum Note (Signed)
Addended by: Johnnette LitterARDWELL, Tommy Minichiello M on: 05/17/2015 10:46 AM   Modules accepted: Orders

## 2015-05-18 ENCOUNTER — Telehealth: Payer: Self-pay | Admitting: Urgent Care

## 2015-05-18 LAB — URINE CULTURE: Colony Count: 5000

## 2015-05-18 LAB — GC/CHLAMYDIA PROBE AMP
CT Probe RNA: NEGATIVE
GC PROBE AMP APTIMA: NEGATIVE

## 2015-05-18 NOTE — Telephone Encounter (Signed)
Left message with report of negative results. Recommended he continue taking cipro x2 weeks for prostatitis. rtc if symptoms fail to resolve, consider referral to urology thereafter.

## 2016-02-01 ENCOUNTER — Ambulatory Visit (INDEPENDENT_AMBULATORY_CARE_PROVIDER_SITE_OTHER): Payer: Self-pay | Admitting: Urgent Care

## 2016-02-01 VITALS — BP 110/72 | HR 99 | Temp 97.9°F | Resp 18 | Ht 66.5 in | Wt 156.4 lb

## 2016-02-01 DIAGNOSIS — R634 Abnormal weight loss: Secondary | ICD-10-CM

## 2016-02-01 DIAGNOSIS — R7303 Prediabetes: Secondary | ICD-10-CM

## 2016-02-01 DIAGNOSIS — R102 Pelvic and perineal pain: Secondary | ICD-10-CM

## 2016-02-01 LAB — POCT GLYCOSYLATED HEMOGLOBIN (HGB A1C): Hemoglobin A1C: 6

## 2016-02-01 LAB — POCT URINALYSIS DIP (MANUAL ENTRY)
Bilirubin, UA: NEGATIVE
GLUCOSE UA: NEGATIVE
Ketones, POC UA: NEGATIVE
Nitrite, UA: NEGATIVE
Protein Ur, POC: NEGATIVE
SPEC GRAV UA: 1.01
Urobilinogen, UA: 0.2
pH, UA: 6

## 2016-02-01 LAB — POCT CBC
Granulocyte percent: 72.6 %G (ref 37–80)
HEMATOCRIT: 46.9 % (ref 43.5–53.7)
Hemoglobin: 16.4 g/dL (ref 14.1–18.1)
Lymph, poc: 1.5 (ref 0.6–3.4)
MCH, POC: 31.1 pg (ref 27–31.2)
MCHC: 35 g/dL (ref 31.8–35.4)
MCV: 88.8 fL (ref 80–97)
MID (cbc): 0.6 (ref 0–0.9)
MPV: 7.3 fL (ref 0–99.8)
POC GRANULOCYTE: 5.7 (ref 2–6.9)
POC LYMPH %: 19.5 % (ref 10–50)
POC MID %: 7.9 %M (ref 0–12)
Platelet Count, POC: 241 10*3/uL (ref 142–424)
RBC: 5.28 M/uL (ref 4.69–6.13)
RDW, POC: 13.5 %
WBC: 7.8 10*3/uL (ref 4.6–10.2)

## 2016-02-01 LAB — POC MICROSCOPIC URINALYSIS (UMFC): Mucus: ABSENT

## 2016-02-01 LAB — BASIC METABOLIC PANEL
BUN: 13 mg/dL (ref 7–25)
CALCIUM: 9.2 mg/dL (ref 8.6–10.3)
CHLORIDE: 102 mmol/L (ref 98–110)
CO2: 28 mmol/L (ref 20–31)
Creat: 0.77 mg/dL (ref 0.60–1.35)
Glucose, Bld: 96 mg/dL (ref 65–99)
Potassium: 4.2 mmol/L (ref 3.5–5.3)
SODIUM: 137 mmol/L (ref 135–146)

## 2016-02-01 NOTE — Patient Instructions (Addendum)
La diabetes mellitus y los alimentos (Diabetes Mellitus and Food) Es importante que controle su nivel de azcar en la sangre (glucosa). El nivel de glucosa en sangre depende en gran medida de lo que usted come. Comer alimentos saludables en las cantidades adecuadas a lo largo del da, aproximadamente a la misma hora todos los das, lo ayudar a controlar su nivel de glucosa en sangre. Tambin puede ayudarlo a retrasar o evitar el empeoramiento de la diabetes mellitus. Comer de manera saludable incluso puede ayudarlo a mejorar el nivel de presin arterial y a alcanzar o mantener un peso saludable.  Entre las recomendaciones generales para alimentarse y cocinar los alimentos de forma saludable, se incluyen las siguientes:  Respetar las comidas principales y comer colaciones con regularidad. Evitar pasar largos perodos sin comer con el fin de perder peso.  Seguir una dieta que consista principalmente en alimentos de origen vegetal, como frutas, vegetales, frutos secos, legumbres y cereales integrales.  Utilizar mtodos de coccin a baja temperatura, como hornear, en lugar de mtodos de coccin a alta temperatura, como frer en abundante aceite. Trabaje con el nutricionista para aprender a usar la informacin nutricional de las etiquetas de los alimentos. CMO PUEDEN AFECTARME LOS ALIMENTOS? Carbohidratos Los carbohidratos afectan el nivel de glucosa en sangre ms que cualquier otro tipo de alimento. El nutricionista lo ayudar a determinar cuntos carbohidratos puede consumir en cada comida y ensearle a contarlos. El recuento de carbohidratos es importante para mantener la glucosa en sangre en un nivel saludable, en especial si utiliza insulina o toma determinados medicamentos para la diabetes mellitus. Alcohol El alcohol puede provocar disminuciones sbitas de la glucosa en sangre (hipoglucemia), en especial si utiliza insulina o toma determinados medicamentos para la diabetes mellitus. La  hipoglucemia es una afeccin que puede poner en peligro la vida. Los sntomas de la hipoglucemia (somnolencia, mareos y desorientacin) son similares a los sntomas de haber consumido mucho alcohol.  Si el mdico lo autoriza a beber alcohol, hgalo con moderacin y siga estas pautas:  Las mujeres no deben beber ms de un trago por da, y los hombres no deben beber ms de dos tragos por da. Un trago es igual a:  12 onzas (355 ml) de cerveza  5 onzas de vino (150 ml) de vino  1,5onzas (45ml) de bebidas espirituosas  No beba con el estmago vaco.  Mantngase hidratado. Beba agua, gaseosas dietticas o t helado sin azcar.  Las gaseosas comunes, los jugos y otros refrescos podran contener muchos carbohidratos y se deben contar. QU ALIMENTOS NO SE RECOMIENDAN? Cuando haga las elecciones de alimentos, es importante que recuerde que todos los alimentos son distintos. Algunos tienen menos nutrientes que otros por porcin, aunque podran tener la misma cantidad de caloras o carbohidratos. Es difcil darle al cuerpo lo que necesita cuando consume alimentos con menos nutrientes. Estos son algunos ejemplos de alimentos que debera evitar ya que contienen muchas caloras y carbohidratos, pero pocos nutrientes:  Grasas trans (la mayora de los alimentos procesados incluyen grasas trans en la etiqueta de Informacin nutricional).  Gaseosas comunes.  Jugos.  Caramelos.  Dulces, como tortas, pasteles, rosquillas y galletas.  Comidas fritas. QU ALIMENTOS PUEDO COMER? Consuma alimentos ricos en nutrientes, que nutrirn el cuerpo y lo mantendrn saludable. Los alimentos que debe comer tambin dependern de varios factores, como:  Las caloras que necesita.  Los medicamentos que toma.  Su peso.  El nivel de glucosa en sangre.  El nivel de presin arterial.  El nivel de colesterol.   Debe consumir una amplia variedad de alimentos, por ejemplo:  Protenas.  Cortes de carne  magros.  Protenas con bajo contenido de grasas saturadas, como pescado, clara de huevo y frijoles. Evite las carnes procesadas.  Frutas y vegetales.  Frutas y vegetales que pueden ayudar a controlar los niveles sanguneos de glucosa, como manzanas, mangos y batatas.  Productos lcteos.  Elija productos lcteos sin grasa o con bajo contenido de grasa, como leche, yogur y queso.  Cereales, panes, pastas y arroz.  Elija cereales integrales, como panes multicereales, avena en grano y arroz integral. Estos alimentos pueden ayudar a controlar la presin arterial.  Grasas.  Alimentos que contengan grasas saludables, como frutos secos, aguacate, aceite de oliva, aceite de canola y pescado. TODOS LOS QUE PADECEN DIABETES MELLITUS TIENEN EL MISMO PLAN DE COMIDAS? Dado que todas las personas que padecen diabetes mellitus son distintas, no hay un solo plan de comidas que funcione para todos. Es muy importante que se rena con un nutricionista que lo ayudar a crear un plan de comidas adecuado para usted.   Esta informacin no tiene como fin reemplazar el consejo del mdico. Asegrese de hacerle al mdico cualquier pregunta que tenga.   Document Released: 01/21/2008 Document Revised: 11/04/2014 Elsevier Interactive Patient Education 2016 Elsevier Inc.     IF you received an x-ray today, you will receive an invoice from [facility]. Please contact [facility] at [phone] with questions or concerns regarding your invoice.   IF you received labwork today, you will receive an invoice from [facility]. Please contact [facility] at [phone] with questions or concerns regarding your invoice.   Our billing staff will not be able to assist you with questions regarding bills from these companies.  You will be contacted with the lab results as soon as they are available. The fastest way to get your results is to activate your My Chart account.  Instructions are located on the last page of this paperwork. If you have not heard from us regarding the results in 2 weeks, please contact this office.      

## 2016-02-01 NOTE — Progress Notes (Signed)
MRN: [id number] DOB: Jul 03, 1966  Subjective:   Wayne Michael is a 50 y.o. male presenting for chief complaint of Follow-up  Reports ~1 month history of recurrent pelvic pressure/pain, spots of blood in his urine. Hydrates very well. Denies fever, n/v, abdominal pain. Has previously been treated for prostatitis with good response using ciprofloxacin. He is also concerned about weight loss. Admits unhealthy diet, no exercise. Has occasional numbness and tingling of his hands and feet. He drinks a lot of water and does urinate frequently. Denies polydipsia, blurred vision, fatigue.   Silvino currently has no medications in their medication list. Also is allergic to penicillins.  Raziel  has no past medical history on file. Also  has no past surgical history on file.  Objective:   Vitals: BP 110/72 mmHg  Pulse 99  Temp(Src) 97.9 F (36.6 C) (Oral)  Resp 18  Ht 5' 6.5" (1.689 m)  Wt 156 lb 6.4 oz (70.943 kg)  BMI 24.87 kg/m2  SpO2 99%  Wt Readings from Last 3 Encounters:  02/01/16 156 lb 6.4 oz (70.943 kg)  05/16/15 163 lb (73.936 kg)  04/03/15 160 lb (72.576 kg)   Physical Exam  Constitutional: He is oriented to person, place, and time. He appears well-developed and well-nourished.  HENT:  Mouth/Throat: Oropharynx is clear and moist.  Eyes: No scleral icterus.  Cardiovascular: Normal rate, regular rhythm and intact distal pulses.  Exam reveals no gallop and no friction rub.   No murmur heard. Pulmonary/Chest: No respiratory distress. He has no wheezes. He has no rales.  Abdominal: Soft. Bowel sounds are normal. He exhibits no distension and no mass. There is no tenderness.  Genitourinary: Rectal exam shows no external hemorrhoid, no fissure, no mass, no tenderness and anal tone normal. Prostate is not enlarged and not tender.  Neurological: He is alert and oriented to person, place, and time.  Skin: Skin is warm and dry.   Results for orders placed or performed in  visit on 02/01/16 (from the past 24 hour(s))  POCT urinalysis dipstick     Status: Abnormal   Collection Time: 02/01/16 12:43 PM  Result Value Ref Range   Color, UA yellow yellow   Clarity, UA clear clear   Glucose, UA negative negative   Bilirubin, UA negative negative   Ketones, POC UA negative negative   Spec Grav, UA 1.010    Blood, UA trace-lysed (A) negative   pH, UA 6.0    Protein Ur, POC negative negative   Urobilinogen, UA 0.2    Nitrite, UA Negative Negative   Leukocytes, UA Trace (A) Negative  POCT Microscopic Urinalysis ([facility])     Status: Abnormal   Collection Time: 02/01/16 12:43 PM  Result Value Ref Range   WBC,UR,HPF,POC None None WBC/hpf   RBC,UR,HPF,POC None None RBC/hpf   Bacteria None None, Too numerous to count   Mucus Absent Absent   Epithelial Cells, UR Per Microscopy Few (A) None, Too numerous to count cells/hpf  POCT CBC     Status: None   Collection Time: 02/01/16 12:44 PM  Result Value Ref Range   WBC 7.8 4.6 - 10.2 K/uL   Lymph, poc 1.5 0.6 - 3.4   POC LYMPH PERCENT 19.5 10 - 50 %L   MID (cbc) 0.6 0 - 0.9   POC MID % 7.9 0 - 12 %M   POC Granulocyte 5.7 2 - 6.9   Granulocyte percent 72.6 37 - 80 %G   RBC 5.28 4.69 - 6.13 M/uL  Hemoglobin 16.4 14.1 - 18.1 g/dL   HCT, POC 19.146.9 47.843.5 - 53.7 %   MCV 88.8 80 - 97 fL   MCH, POC 31.1 27 - 31.2 pg   MCHC 35.0 31.8 - 35.4 g/dL   RDW, POC 29.513.5 %   Platelet Count, POC 241 142 - 424 K/uL   MPV 7.3 0 - 99.8 fL  POCT glycosylated hemoglobin (Hb A1C)     Status: None   Collection Time: 02/01/16 12:44 PM  Result Value Ref Range   Hemoglobin A1C 6.0    Assessment and Plan :   1. Pelvic pain in male - Unclear etiology. I do no suspect that this is infectious. Patient declined STI testing. He was negative for all this 04/2015. PSA level is pending. Will refer to urology if all tests are normal and his pelvic pressure persists.  2. Pre-diabetes 3. Loss of weight - Counseled on dietary modifications.  Recheck in 6 months.  Wallis BambergMario Calib Wadhwa, PA-C [facility] [phone] 02/01/2016 12:21 PM

## 2016-02-02 LAB — PSA: PSA: 3.11 ng/mL (ref <=4.00)

## 2017-07-25 ENCOUNTER — Ambulatory Visit (INDEPENDENT_AMBULATORY_CARE_PROVIDER_SITE_OTHER): Payer: Worker's Compensation | Admitting: Urgent Care

## 2017-07-25 ENCOUNTER — Encounter: Payer: Self-pay | Admitting: Urgent Care

## 2017-07-25 ENCOUNTER — Ambulatory Visit (INDEPENDENT_AMBULATORY_CARE_PROVIDER_SITE_OTHER): Payer: Worker's Compensation

## 2017-07-25 VITALS — BP 104/74 | HR 72 | Temp 98.2°F | Resp 16 | Ht 66.5 in | Wt 162.8 lb

## 2017-07-25 DIAGNOSIS — M545 Low back pain, unspecified: Secondary | ICD-10-CM

## 2017-07-25 DIAGNOSIS — M546 Pain in thoracic spine: Secondary | ICD-10-CM

## 2017-07-25 DIAGNOSIS — W19XXXA Unspecified fall, initial encounter: Secondary | ICD-10-CM

## 2017-07-25 DIAGNOSIS — Z026 Encounter for examination for insurance purposes: Secondary | ICD-10-CM

## 2017-07-25 LAB — POCT URINALYSIS DIP (MANUAL ENTRY)
BILIRUBIN UA: NEGATIVE mg/dL
Bilirubin, UA: NEGATIVE
Blood, UA: NEGATIVE
GLUCOSE UA: NEGATIVE mg/dL
Nitrite, UA: NEGATIVE
PROTEIN UA: NEGATIVE mg/dL
Spec Grav, UA: 1.015 (ref 1.010–1.025)
Urobilinogen, UA: 0.2 E.U./dL
pH, UA: 5.5 (ref 5.0–8.0)

## 2017-07-25 MED ORDER — CYCLOBENZAPRINE HCL 5 MG PO TABS: 5.0000 mg | ORAL_TABLET | Freq: Every evening | ORAL | 1 refills | Status: DC | PRN

## 2017-07-25 MED ORDER — CELECOXIB 100 MG PO CAPS: 100.0000 mg | ORAL_CAPSULE | Freq: Two times a day (BID) | ORAL | 1 refills | Status: DC

## 2017-07-25 NOTE — Patient Instructions (Signed)
Dolor de espalda en adultos (Back Pain, Adult) El dolor de espalda es muy frecuente. A menudo mejora con el tiempo. La causa del dolor de espalda generalmente no es peligrosa. La mayora de las personas puede aprender a manejar el dolor de espalda por s mismas. CUIDADOS EN EL HOGAR Controle su dolor de espalda a fin de detectar algn cambio. Las siguientes indicaciones ayudarn a aliviar cualquier dolor que pueda sentir:  Mantngase activo. Comience con caminatas cortas sobre superficies planas si es posible. Trate de caminar un poco ms cada da.  Haga ejercicios con regularidad tal como le indic el mdico. El ejercicio ayuda a que su espalda se cure ms rpidamente. Tambin ayuda a prevenir futuras lesiones al mantener los msculos fuertes y flexibles.  No se siente, conduzca ni permanezca de pie durante ms de 30 minutos.  No permanezca en la cama. Si hace reposo ms de 1 a 2 das, puede demorar su recuperacin.  Sea cuidadoso al inclinarse o levantar un objeto. Use una tcnica apropiada para levantar peso: ? Flexione las rodillas. ? Mantenga el objeto cerca del cuerpo. ? No gire.  Duerma sobre un colchn firme. Recustese sobre un costado y flexione las rodillas. Si se recuesta sobre la espalda, coloque una almohada debajo de las rodillas.  Tome los medicamentos solamente como se lo haya indicado el mdico.  Aplique hielo sobre la zona lesionada. ? Ponga el hielo en una bolsa plstica. ? Coloque una toalla entre la piel y la bolsa de hielo. ? Deje el hielo durante 20minutos, 2 a 3veces por da, durante los primeros 2 o 3das. Despus de eso, puede alternar entre compresas de hielo y calor.  Evite sentir ansiedad o estrs. Encuentre maneras efectivas de lidiar con el estrs, como hacer ejercicio.  Mantenga un peso saludable. El peso excesivo ejerce tensin sobre la espalda. SOLICITE AYUDA SI:  Siente dolor que no se alivia con reposo o medicamentos.  Siente cada vez  ms dolor que se extiende a las piernas o los glteos.  El dolor no mejora en una semana.  Siente dolor por la noche.  Pierde peso.  Siente escalofros o fiebre. SOLICITE AYUDA DE INMEDIATO SI:  No puede controlar su materia fecal (heces) o el pis (orina).  Siente debilidad en las piernas o los brazos.  Siente prdida de la sensibilidad (adormecimiento) en las piernas o los brazos.  Tiene malestar estomacal (nuseas) o vomita.  Siente dolor de estmago (abdominal).  Siente que se desvanece (se desmaya). Esta informacin no tiene como fin reemplazar el consejo del mdico. Asegrese de hacerle al mdico cualquier pregunta que tenga. Document Released: 04/29/2011 Document Revised: 11/04/2014 Document Reviewed: 02/15/2014 Elsevier Interactive Patient Education  2018 Elsevier Inc.   IF you received an x-ray today, you will receive an invoice from [facility]. Please contact [facility] at [phone] with questions or concerns regarding your invoice.   IF you received labwork today, you will receive an invoice from [facility]. Please contact [facility] at [phone] with questions or concerns regarding your invoice.   Our billing staff will not be able to assist you with questions regarding bills from these companies.  You will be contacted with the lab results as soon as they are available. The fastest way to get your results is to activate your My Chart account. Instructions are located on the last page of this paperwork. If you have not heard from us regarding the results in 2 weeks, please contact this office.     

## 2017-07-25 NOTE — Progress Notes (Deleted)
    MRN: [id number] DOB: 1966-10-01  Subjective:   Wayne Michael is a 51 y.o. male presenting for chief complaint of Back Pain (fell on back at work; states pain is more on the inside(muscle))     Wayne Michael currently has no medications in their medication list. Also is allergic to penicillins.  Wayne Michael  has no past medical history on file. Also  has no past surgical history on file.  Objective:   Vitals: BP 104/74   Pulse 72   Temp 98.2 F (36.8 C) (Oral)   Resp 16   Ht 5' 6.5" (1.689 m)   Wt 162 lb 12.8 oz (73.8 kg)   SpO2 96%   BMI 25.88 kg/m   Physical Exam  No results found for this or any previous visit (from the past 24 hour(s)).  Assessment and Plan :     Wayne Bamberg, PA-C Primary Care at [facility] [phone] 07/25/2017  4:40 PM

## 2017-07-25 NOTE — Progress Notes (Signed)
   MRN: [id number] DOB: Feb 23, 1966  Subjective:   Wayne Michael is a 51 y.o. male presenting for worker's comp visit.   Reports 1 week history of suffering a fall while at work. Since then he has had progressive constant with intermittent sharp pains over his mid-right back worse with bending, lifting, reaching. Has not tried any medications relief. Denies fever, numbness or tingling, weakness, n/v, abdominal pain, hematuria.   Lumir's medications list, allergies, past medical history and past surgical history were reviewed and excluded from this note due to being a worker's comp case.  Objective:   Vitals: BP 104/74   Pulse 72   Temp 98.2 F (36.8 C) (Oral)   Resp 16   Ht 5' 6.5" (1.689 m)   Wt 162 lb 12.8 oz (73.8 kg)   SpO2 96%   BMI 25.88 kg/m   Physical Exam  Constitutional: He is oriented to person, place, and time. He appears well-developed and well-nourished.  Cardiovascular: Normal rate.   Pulmonary/Chest: Effort normal.  Musculoskeletal:       Lumbar back: He exhibits tenderness (with ROM testing to the right, over area depicted). He exhibits normal range of motion, no bony tenderness, no swelling, no edema, no deformity, no laceration and no spasm.       Back:  Neurological: He is alert and oriented to person, place, and time. He displays normal reflexes. Coordination normal.  Skin: Skin is warm and dry.  Psychiatric: He has a normal mood and affect.   Dg Lumbar Spine Complete  Result Date: 07/25/2017 CLINICAL DATA:  Right-sided low back pain EXAM: LUMBAR SPINE - COMPLETE 4+ VIEW COMPARISON:  None. FINDINGS: No fracture or listhesis. Moderate disc space loss with endplate remodeling and osteophyte formation at L5-S1. Normal lumbar segmentation. No pars interarticularis fracture. Vertebral body heights are maintained. Moderate L5-S1 facet hypertrophy. IMPRESSION: L5-S1 degenerative disc and facet disease.  No acute abnormality. Electronically Signed   By:  Deatra Robinson M.D.   On: 07/25/2017 17:06   Results for orders placed or performed in visit on 07/25/17 (from the past 24 hour(s))  POCT urinalysis dipstick     Status: Abnormal   Collection Time: 07/25/17  5:20 PM  Result Value Ref Range   Color, UA yellow yellow   Clarity, UA clear clear   Glucose, UA negative negative mg/dL   Bilirubin, UA negative negative   Ketones, POC UA negative negative mg/dL   Spec Grav, UA 4.098 1.191 - 1.025   Blood, UA negative negative   pH, UA 5.5 5.0 - 8.0   Protein Ur, POC negative negative mg/dL   Urobilinogen, UA 0.2 0.2 or 1.0 E.U./dL   Nitrite, UA Negative Negative   Leukocytes, UA Trace (A) Negative      Assessment and Plan :   1. Acute right-sided thoracic back pain 2. Acute right-sided low back pain without sciatica 3. Fall, initial encounter 4. Encounter related to worker's compensation claim - Will start conservative management, use Celebrex, muscle relaxant. Work restrictions provided. Follow up in 1 week if no improvement.  Wallis Bamberg, PA-C Primary Care at [facility] [phone] 07/25/2017 4:41 PM

## 2017-08-01 ENCOUNTER — Encounter: Payer: Self-pay | Admitting: Urgent Care

## 2017-08-01 ENCOUNTER — Ambulatory Visit (INDEPENDENT_AMBULATORY_CARE_PROVIDER_SITE_OTHER): Payer: Worker's Compensation | Admitting: Urgent Care

## 2017-08-01 VITALS — BP 118/80 | HR 79 | Temp 98.2°F | Resp 16 | Ht 66.0 in | Wt 163.4 lb

## 2017-08-01 DIAGNOSIS — M545 Low back pain, unspecified: Secondary | ICD-10-CM

## 2017-08-01 DIAGNOSIS — Z026 Encounter for examination for insurance purposes: Secondary | ICD-10-CM

## 2017-08-01 DIAGNOSIS — W19XXXD Unspecified fall, subsequent encounter: Secondary | ICD-10-CM

## 2017-08-01 NOTE — Patient Instructions (Addendum)
Dolor de espalda en adultos (Back Pain, Adult) El dolor de espalda es muy frecuente. A menudo mejora con el tiempo. La causa del dolor de espalda generalmente no es peligrosa. La mayora de las personas puede aprender a manejar el dolor de espalda por s mismas. CUIDADOS EN EL HOGAR Controle su dolor de espalda a fin de detectar algn cambio. Las siguientes indicaciones ayudarn a aliviar cualquier dolor que pueda sentir:  Mantngase activo. Comience con caminatas cortas sobre superficies planas si es posible. Trate de caminar un poco ms cada da.  Haga ejercicios con regularidad tal como le indic el mdico. El ejercicio ayuda a que su espalda se cure ms rpidamente. Tambin ayuda a prevenir futuras lesiones al mantener los msculos fuertes y flexibles.  No se siente, conduzca ni permanezca de pie durante ms de 30 minutos.  No permanezca en la cama. Si hace reposo ms de 1 a 2 das, puede demorar su recuperacin.  Sea cuidadoso al inclinarse o levantar un objeto. Use una tcnica apropiada para levantar peso: ? Flexione las rodillas. ? Mantenga el objeto cerca del cuerpo. ? No gire.  Duerma sobre un colchn firme. Recustese sobre un costado y flexione las rodillas. Si se recuesta sobre la espalda, coloque una almohada debajo de las rodillas.  Tome los medicamentos solamente como se lo haya indicado el mdico.  Aplique hielo sobre la zona lesionada. ? Ponga el hielo en una bolsa plstica. ? Coloque una toalla entre la piel y la bolsa de hielo. ? Deje el hielo durante 20minutos, 2 a 3veces por da, durante los primeros 2 o 3das. Despus de eso, puede alternar entre compresas de hielo y calor.  Evite sentir ansiedad o estrs. Encuentre maneras efectivas de lidiar con el estrs, como hacer ejercicio.  Mantenga un peso saludable. El peso excesivo ejerce tensin sobre la espalda. SOLICITE AYUDA SI:  Siente dolor que no se alivia con reposo o medicamentos.  Siente cada vez  ms dolor que se extiende a las piernas o los glteos.  El dolor no mejora en una semana.  Siente dolor por la noche.  Pierde peso.  Siente escalofros o fiebre. SOLICITE AYUDA DE INMEDIATO SI:  No puede controlar su materia fecal (heces) o el pis (orina).  Siente debilidad en las piernas o los brazos.  Siente prdida de la sensibilidad (adormecimiento) en las piernas o los brazos.  Tiene malestar estomacal (nuseas) o vomita.  Siente dolor de estmago (abdominal).  Siente que se desvanece (se desmaya). Esta informacin no tiene como fin reemplazar el consejo del mdico. Asegrese de hacerle al mdico cualquier pregunta que tenga. Document Released: 04/29/2011 Document Revised: 11/04/2014 Document Reviewed: 02/15/2014 Elsevier Interactive Patient Education  2018 Elsevier Inc.   IF you received an x-ray today, you will receive an invoice from [facility]. Please contact [facility] at [phone] with questions or concerns regarding your invoice.   IF you received labwork today, you will receive an invoice from [facility]. Please contact [facility] at [phone] with questions or concerns regarding your invoice.   Our billing staff will not be able to assist you with questions regarding bills from these companies.  You will be contacted with the lab results as soon as they are available. The fastest way to get your results is to activate your My Chart account. Instructions are located on the last page of this paperwork. If you have not heard from us regarding the results in 2 weeks, please contact this office.     

## 2017-08-01 NOTE — Progress Notes (Signed)
    MRN: [id number] DOB: 1966-08-08  Subjective:   Wayne Michael is a 51 y.o. male presenting for worker's comp visit.   Reports improvement in his low back pain, last OV was 07/25/2017. Still has right-sided low back pain, worse with lifting, bending, stooping. He has used celecoxib, Flexeril. Denies falls, trauma. He is following work restrictions.   Wayne Michael's medications list, allergies, past medical history and past surgical history were reviewed and excluded from this note due to being a worker's comp case.  Objective:   Vitals: BP 118/80   Pulse 79   Temp 98.2 F (36.8 C) (Oral)   Resp 16   Ht  (1.676 m)   Wt 163 lb 6.4 oz (74.1 kg)   SpO2 98%   BMI 26.37 kg/m   Physical Exam  Constitutional: He is oriented to person, place, and time. He appears well-developed and well-nourished.  Cardiovascular: Normal rate.   Pulmonary/Chest: Effort normal.  Musculoskeletal:       Lumbar back: He exhibits tenderness (with ROM testing and over area depicted). He exhibits normal range of motion, no bony tenderness, no swelling, no edema, no deformity and no spasm.       Back:       Arms: Negative SLR.  Neurological: He is alert and oriented to person, place, and time.   Assessment and Plan :   1. Acute right-sided low back pain without sciatica 2. Fall, subsequent encounter 3. Encounter related to worker's compensation claim - Will maintain work restrictions, celecoxib and Flexeril. Referral for PT is pending. Follow up on 08/19/2017 unless PT is approved. In that case, f/u after PT is completed.   Wallis Bamberg, PA-C Primary Care at [facility] [phone] 08/01/2017 4:26 PM

## 2017-08-04 ENCOUNTER — Telehealth: Payer: Self-pay | Admitting: Urgent Care

## 2017-08-04 NOTE — Telephone Encounter (Signed)
Pt's W/C adjustor called and spoke to [facility] concerning PT referral. Notes state that PT referral is pending but I do not see PT referral placed. Pt's W/C adjustor called to clarify if we are wanting PT so they can work on prior [place] if so. Please advise. If PT is desired, I will let Renae Fickle know so he can contact W/C adjustor. Thanks!

## 2017-08-07 NOTE — Telephone Encounter (Signed)
Thank you! I spoke with Renae Fickle and he said the adjustor is going to call back today and he will let her know that PT referral has been placed so they can work on [place].

## 2017-08-07 NOTE — Addendum Note (Signed)
Addended by: Wallis Bamberg on: 08/07/2017 11:03 AM   Modules accepted: Orders

## 2017-08-07 NOTE — Telephone Encounter (Signed)
My apologies! Referral was placed. Thank you!

## 2017-08-21 ENCOUNTER — Ambulatory Visit: Payer: Self-pay | Admitting: Urgent Care

## 2017-08-26 ENCOUNTER — Encounter: Payer: Self-pay | Admitting: Urgent Care

## 2017-08-26 ENCOUNTER — Ambulatory Visit (INDEPENDENT_AMBULATORY_CARE_PROVIDER_SITE_OTHER): Payer: Worker's Compensation | Admitting: Urgent Care

## 2017-08-26 VITALS — BP 100/70 | HR 70 | Temp 98.5°F | Resp 16 | Ht 66.0 in | Wt 161.8 lb

## 2017-08-26 DIAGNOSIS — Z026 Encounter for examination for insurance purposes: Secondary | ICD-10-CM | POA: Diagnosis not present

## 2017-08-26 DIAGNOSIS — M545 Low back pain, unspecified: Secondary | ICD-10-CM

## 2017-08-26 DIAGNOSIS — W19XXXD Unspecified fall, subsequent encounter: Secondary | ICD-10-CM

## 2017-08-26 DIAGNOSIS — M546 Pain in thoracic spine: Secondary | ICD-10-CM | POA: Diagnosis not present

## 2017-08-26 NOTE — Progress Notes (Signed)
    MRN: [id number] DOB: 05/25/1966  Subjective:   Wayne Michael is a 51 y.o. male presenting for worker's comp visit.   Reports that his back pain is resolved. 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 He has been going to physical therapy and does not feel a need to continue going. He denies back pain, radiculopathy, numbness or tingling, incontinence, falls.   Saw's medications list, allergies, past medical history and past surgical history were reviewed and excluded from this note due to being a worker's comp case.   Objective:   Vitals: BP 100/70   Pulse 70   Temp 98.5 F (36.9 C) (Oral)   Resp 16   Ht 5\' 6"  (1.676 m)   Wt 161 lb 12.8 oz (73.4 kg)   SpO2 97%   BMI 26.12 kg/m   Physical Exam  Constitutional: He is oriented to person, place, and time. He appears well-developed and well-nourished.  Cardiovascular: Normal rate.   Pulmonary/Chest: Effort normal.  Musculoskeletal:       Lumbar back: He exhibits normal range of motion, no tenderness, no bony tenderness, no swelling, no edema, no deformity and no spasm.  Negative SLR.  Neurological: He is alert and oriented to person, place, and time.   Assessment and Plan :   1. Acute right-sided low back pain without sciatica 2. Acute right-sided thoracic back pain 3. Fall, subsequent encounter 4. Encounter related to worker's compensation claim - Patient has normal physical exam, it is okay to cancel the remainder of his PT. He is to return to work without restrictions. He may continue using Flexeril, Celebrex. Follow up as needed.  Wallis BambergMario Lavaughn Bisig, PA-C [facility] at [facility] [phone] 08/26/2017 3:35 PM

## 2017-08-26 NOTE — Patient Instructions (Addendum)
Dolor de espalda en adultos (Back Pain, Adult) El dolor de espalda es muy frecuente en los adultos.La causa del dolor de espalda es rara vez peligrosa y el dolor a menudo mejora con el tiempo.Es posible que se desconozca la causa de esta afeccin. Algunas causas comunes son las siguientes:  Distensin de los msculos o ligamentos que sostienen la columna vertebral.  Desgaste (degeneracin) de los discos vertebrales.  Artritis.  Lesiones directas en la espalda. En muchas personas, el dolor de espalda es recurrente. Como rara vez es peligroso, las personas pueden aprender a manejar esta afeccin por s mismas. INSTRUCCIONES PARA EL CUIDADO EN EL HOGAR Controle su dolor de espalda a fin de detectar algn cambio. Las siguientes indicaciones ayudarn a aliviar cualquier molestia que pueda sentir:  Permanezca activo. Si permanece sentado o de pie en un mismo lugar durante mucho tiempo, se tensiona la espalda. No se siente, conduzca o permanezca de pie en un mismo lugar durante ms de 30 minutos seguidos. Realice caminatas cortas en superficies planas tan pronto como le sea posible.Trate de caminar un poco ms de tiempo cada da.  Haga ejercicio regularmente como se lo haya indicado el mdico. El ejercicio ayuda a que su espalda se cure ms rpidamente. Tambin ayuda a prevenir futuras lesiones al mantener los msculos fuertes y flexibles.  No permanezca en la cama.Si hace reposo ms de 1 a 2 das, puede demorar su recuperacin.  Preste atencin a su cuerpo al inclinarse y levantarse. Las posiciones ms cmodas son las que ejercen menos tensin en la espalda en recuperacin. Siempre use tcnicas apropiadas para levantar objetos, como por ejemplo: ? Flexionar las rodillas. ? Mantener la carga cerca del cuerpo. ? No torcerse.  Encuentre una posicin cmoda para dormir. Use un colchn firme y recustese de costado con las rodillas ligeramente flexionadas. Si se recuesta sobre la espalda, coloque  una almohada debajo de las rodillas.  Evite sentir ansiedad o estrs.El estrs aumenta la tensin muscular y puede empeorar el dolor de espalda.Es importante reconocer si se siente ansioso o estresado y aprender maneras de controlarlo, por ejemplo haciendo ejercicio.  Tome los medicamentos solamente como se lo haya indicado el mdico. Los medicamentos de venta libre para aliviar el dolor y la inflamacin a menudo son los ms eficaces.El mdico puede recetarle relajantes musculares.Estos medicamentos ayudan a calmar el dolor de modo que pueda reanudar ms rpidamente sus actividades normales y el ejercicio saludable.  Aplique hielo sobre la zona lesionada. ? Ponga el hielo en una bolsa plstica. ? Coloque una toalla entre la piel y la bolsa de hielo. ? Deje el hielo durante 20minutos, 2 a 3veces por da, durante los primeros 2 o 3das. Despus de eso, puede alternar el hielo y el calor para reducir el dolor y los espasmos.  Mantenga un peso saludable. El exceso de peso ejerce presin adicional sobre la espalda y hace que resulte difcil mantener una buena postura. SOLICITE ATENCIN MDICA SI:  Siente un dolor que no se alivia con reposo o medicamentos.  Siente mucho dolor que se extiende a las piernas o los glteos.  El dolor no mejora en una semana.  Siente dolor por la noche.  Pierde peso.  Siente escalofros o fiebre.  SOLICITE ATENCIN MDICA DE INMEDIATO SI:  Tiene nuevos problemas para controlar la vejiga o los intestinos.  Siente debilidad o adormecimiento inusuales en los brazos o en las piernas.  Siente nuseas o vmitos.  Siente dolor abdominal.  Siente que va a desmayarse.    Esta informacin no tiene como fin reemplazar el consejo del mdico. Asegrese de hacerle al mdico cualquier pregunta que tenga. Document Released: 10/14/2005 Document Revised: 11/04/2014 Document Reviewed: 02/15/2014 Elsevier Interactive Patient Education  2017 Elsevier Inc.     IF  you received an x-ray today, you will receive an invoice from [facility]. Please contact [facility] at [phone] with questions or concerns regarding your invoice.   IF you received labwork today, you will receive an invoice from [facility]. Please contact [facility] at [phone] with questions or concerns regarding your invoice.   Our billing staff will not be able to assist you with questions regarding bills from these companies.  You will be contacted with the lab results as soon as they are available. The fastest way to get your results is to activate your My Chart account. Instructions are located on the last page of this paperwork. If you have not heard from us regarding the results in 2 weeks, please contact this office.     

## 2017-09-01 ENCOUNTER — Telehealth: Payer: Self-pay | Admitting: Urgent Care

## 2017-09-01 NOTE — Telephone Encounter (Signed)
Physical therapist called to see if pt has been released from PT and to go back to work.  She states that the pt said he was feeling better and no longer needed PT.  He did cancel his appt today with them and she is wondering if she needed to cancel the other ones. Please advise patient.  Patient phone: [phone] PT [phone]

## 2017-09-02 NOTE — Telephone Encounter (Signed)
Faxed letter to office releasing pt.

## 2017-09-04 NOTE — Telephone Encounter (Signed)
Letter was provided to patient for both his PT and employer. He no longer needs PT and can have his visits cancelled. John, thank you for forwarding the letter.

## 2018-05-29 IMAGING — DX DG LUMBAR SPINE COMPLETE 4+V
5 series · 5 of 5 positions shown · non-contrast
Comparison: None.

CLINICAL DATA: Right-sided low back pain

EXAM:
LUMBAR SPINE - COMPLETE 4+ VIEW

[l-spine ap]
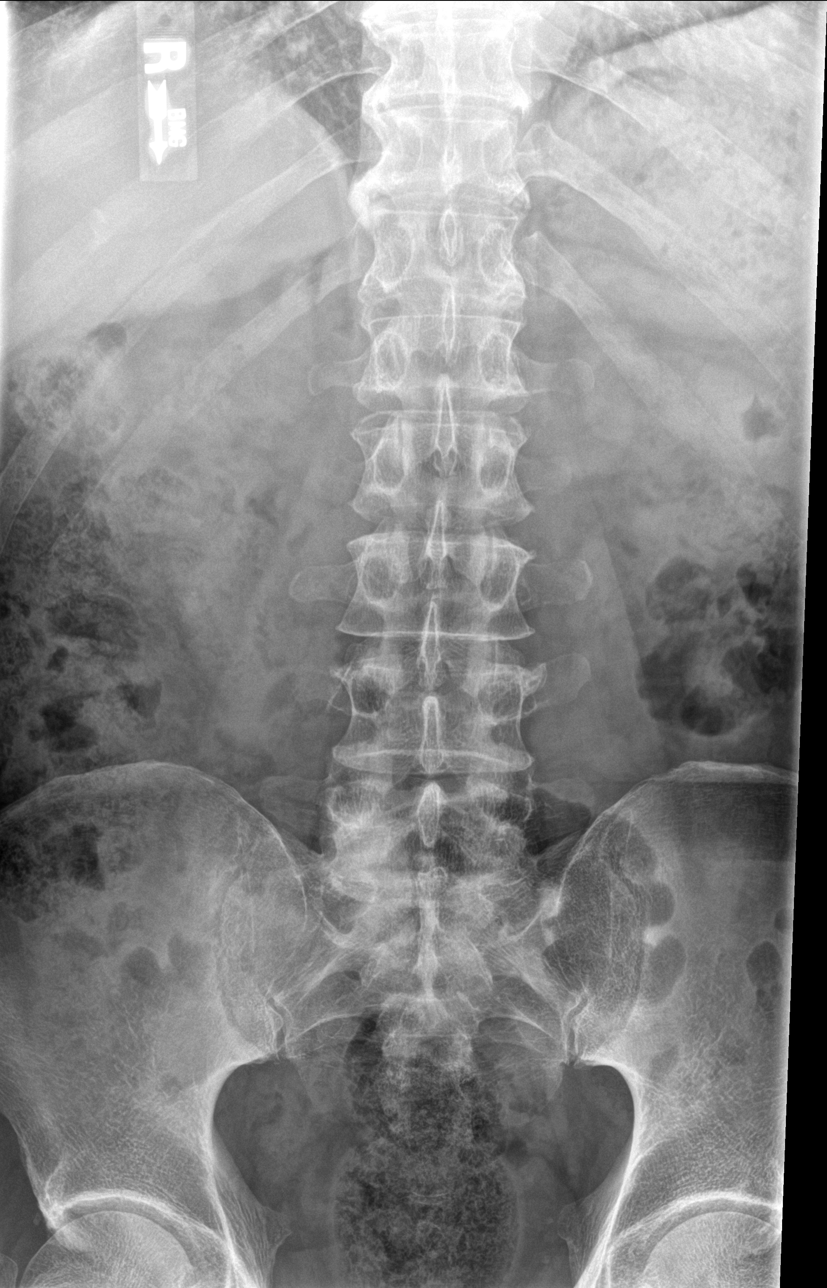

[l-spine obl (1 of 2)]
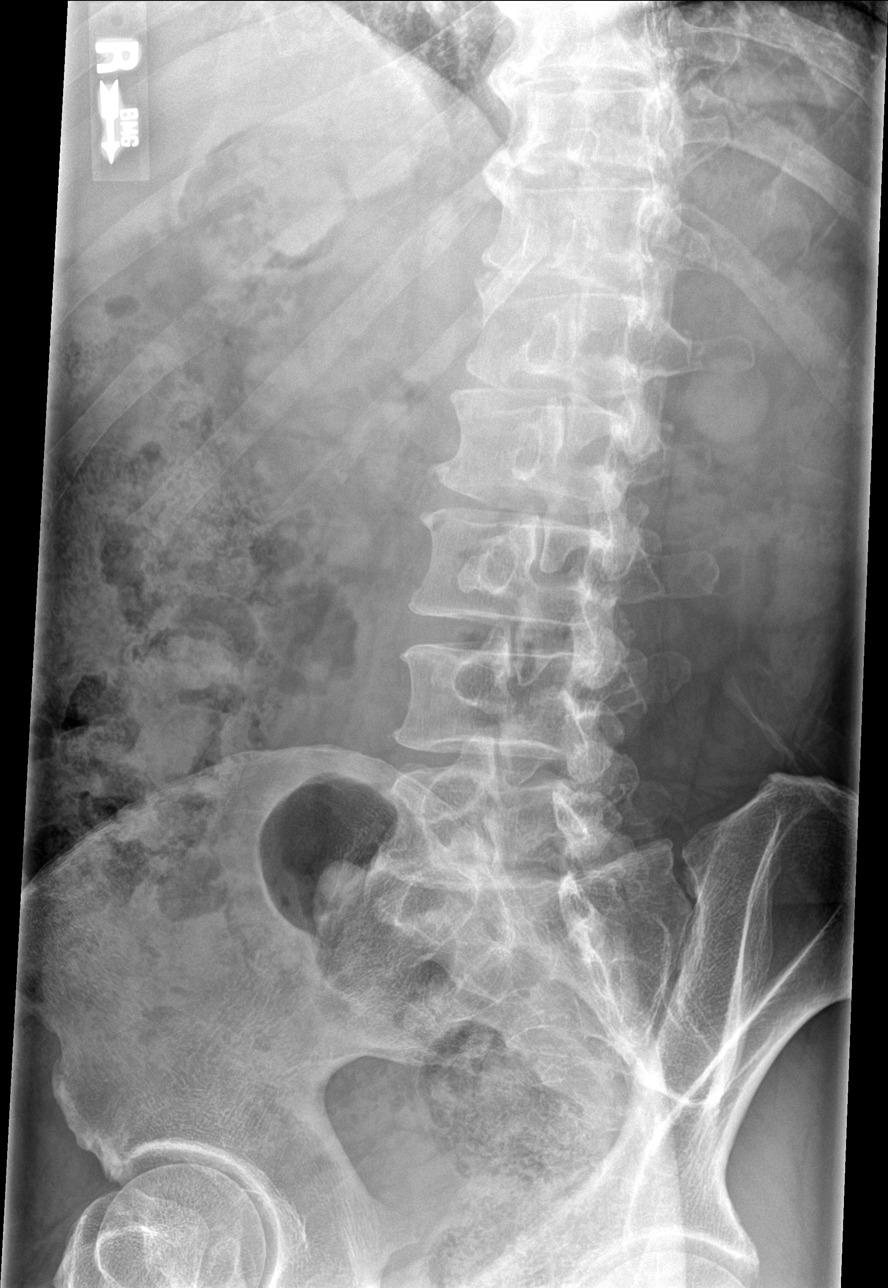

[l-spine obl (2 of 2)]
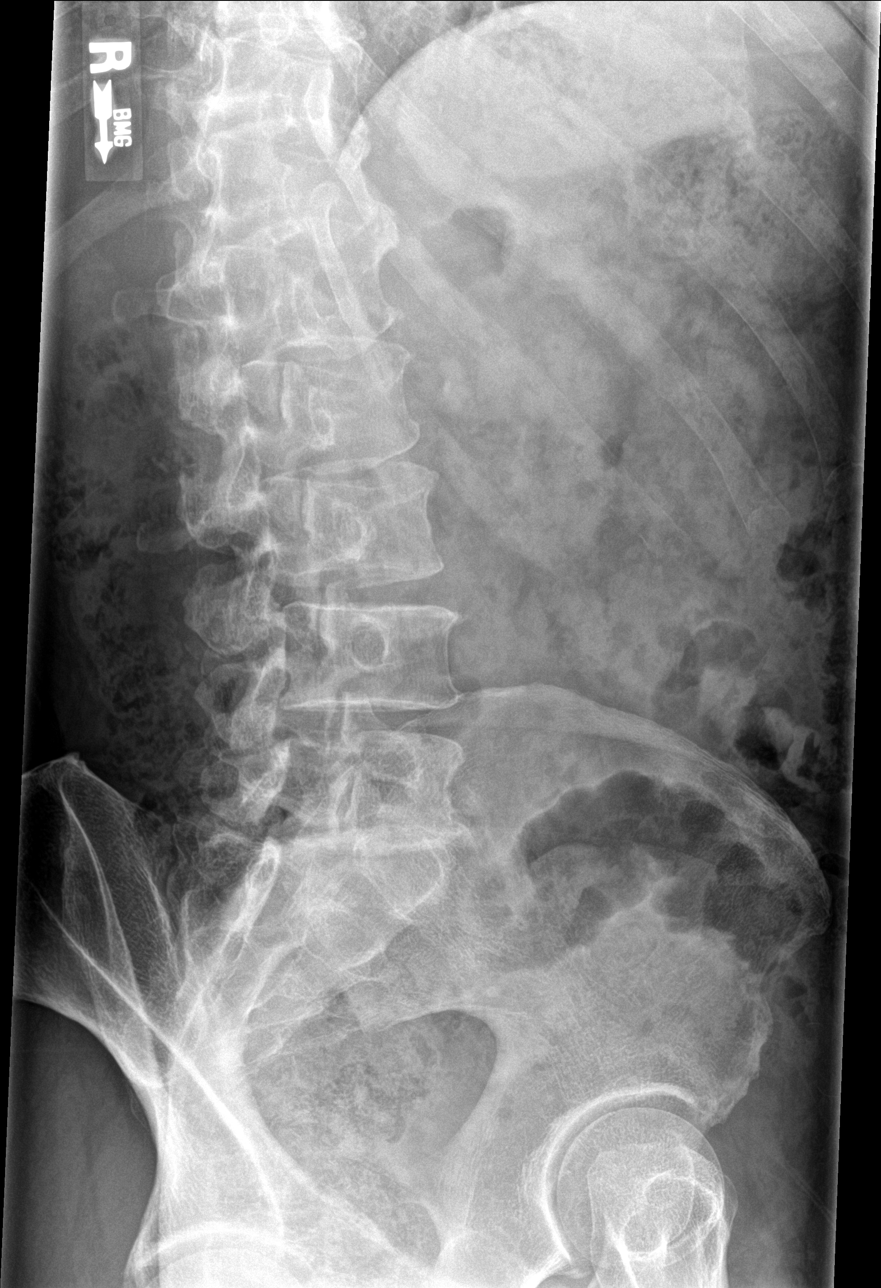

[l-spine lat]
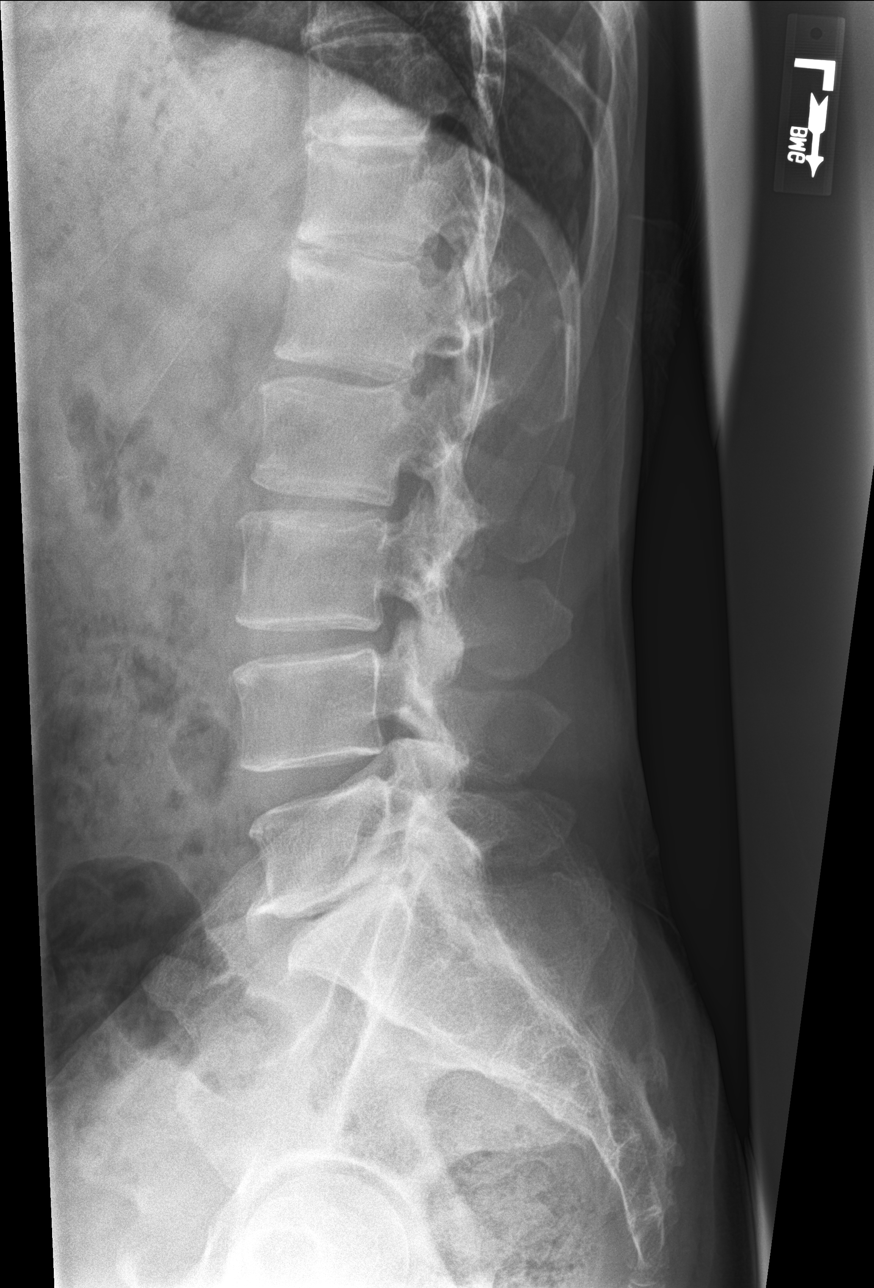

[l-spine l5-s1]
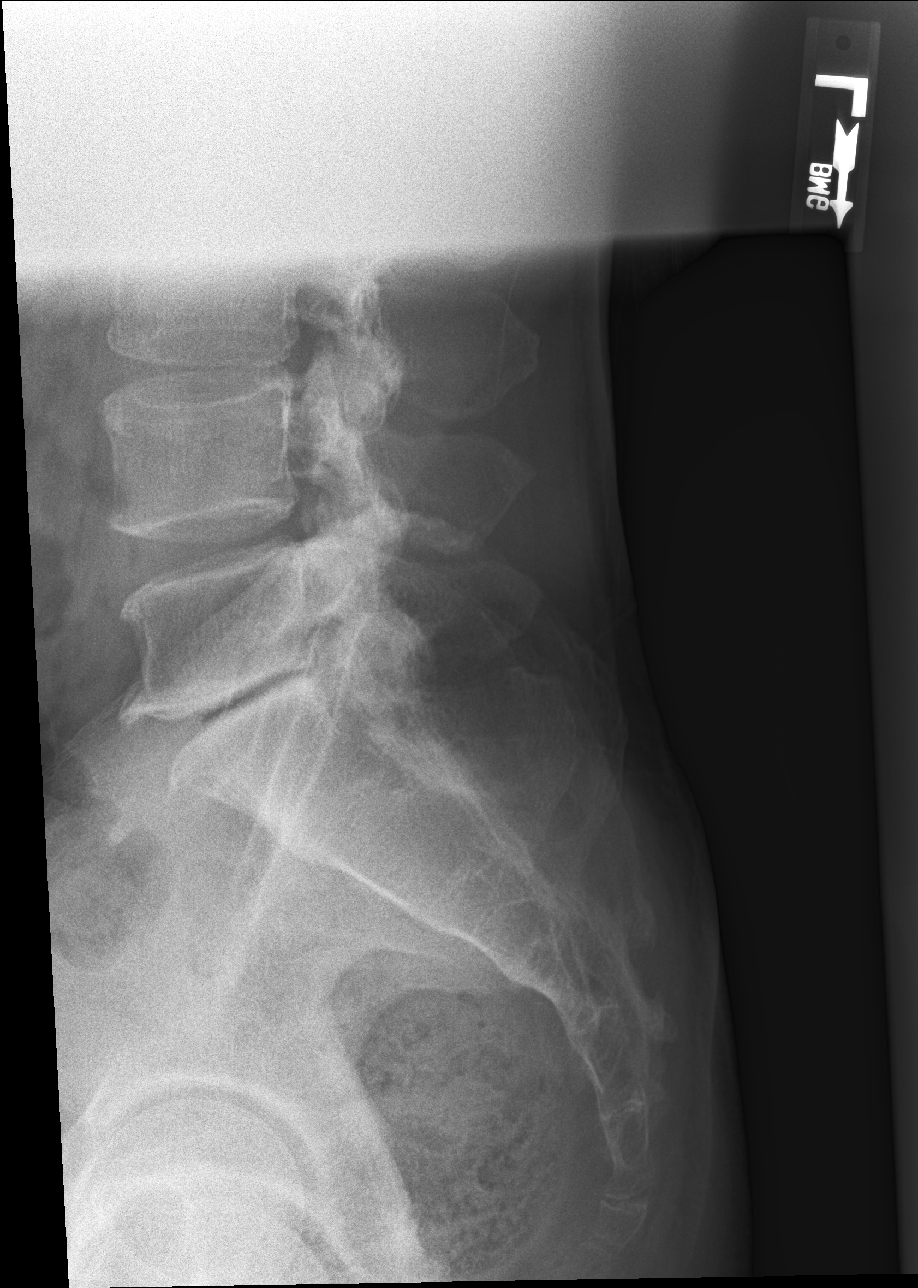

[5 of 5 positions shown; findings below may reference images not displayed]

FINDINGS: No fracture or listhesis. Moderate disc space loss with endplate
remodeling and osteophyte formation at L5-S1. Normal lumbar
segmentation. No pars interarticularis fracture. Vertebral body
heights are maintained. Moderate L5-S1 facet hypertrophy.
IMPRESSION: L5-S1 degenerative disc and facet disease.  No acute abnormality.

## 2018-07-13 ENCOUNTER — Other Ambulatory Visit: Payer: Self-pay

## 2018-07-13 ENCOUNTER — Encounter (INDEPENDENT_AMBULATORY_CARE_PROVIDER_SITE_OTHER): Payer: Self-pay | Admitting: Physician Assistant

## 2018-07-13 ENCOUNTER — Ambulatory Visit (INDEPENDENT_AMBULATORY_CARE_PROVIDER_SITE_OTHER): Payer: Self-pay | Admitting: Physician Assistant

## 2018-07-13 VITALS — BP 107/70 | HR 64 | Temp 97.8°F | Ht 65.0 in | Wt 154.0 lb

## 2018-07-13 DIAGNOSIS — N5312 Painful ejaculation: Secondary | ICD-10-CM

## 2018-07-13 DIAGNOSIS — Z23 Encounter for immunization: Secondary | ICD-10-CM

## 2018-07-13 DIAGNOSIS — R3911 Hesitancy of micturition: Secondary | ICD-10-CM

## 2018-07-13 DIAGNOSIS — Z79899 Other long term (current) drug therapy: Secondary | ICD-10-CM

## 2018-07-13 DIAGNOSIS — N401 Enlarged prostate with lower urinary tract symptoms: Secondary | ICD-10-CM

## 2018-07-13 DIAGNOSIS — R7303 Prediabetes: Secondary | ICD-10-CM

## 2018-07-13 DIAGNOSIS — R300 Dysuria: Secondary | ICD-10-CM

## 2018-07-13 LAB — POCT URINALYSIS DIPSTICK
Bilirubin, UA: NEGATIVE
Blood, UA: NEGATIVE
GLUCOSE UA: NEGATIVE
KETONES UA: NEGATIVE
Leukocytes, UA: NEGATIVE
Nitrite, UA: NEGATIVE
PH UA: 5.5 (ref 5.0–8.0)
Protein, UA: NEGATIVE
Spec Grav, UA: 1.02 (ref 1.010–1.025)
Urobilinogen, UA: 0.2 E.U./dL

## 2018-07-13 LAB — POCT GLYCOSYLATED HEMOGLOBIN (HGB A1C): Hemoglobin A1C: 5.5 % (ref 4.0–5.6)

## 2018-07-13 MED ORDER — TERAZOSIN HCL 2 MG PO CAPS: 2.0000 mg | ORAL_CAPSULE | Freq: Every day | ORAL | 2 refills | Status: DC

## 2018-07-13 NOTE — Progress Notes (Signed)
Subjective:  Patient ID: Wayne Michael, male    DOB: 09/11/1966  Age: 52 y.o. MRN: [id number]  CC:    HPI Wayne Michael is a 52 y.o. male with a medical history of LBP with left sided sciatica, prediabetes, prostatitis and high risk sexual behavior presents as a new patient with one month history of LLE edema and "inflammation of the veins". However, he states his problems of LLE pain began with a fall that occurred at work since 06/2017. Says the LLE pain was associated with sciatica. Sciatic symptoms have resolved but is worried that he still has swelling and vein enlargement in the left leg. Currently taking pentoxifylline for "back pain".     Pt also complains of dysuria, urinary hesitancy, and ejaculatory "burning". Takes Terazosin because he had prostatitis. Reportedly took abx for prostatitis which seemed to have alleviated symptoms. Records show Cipro x14 days and empiric tx with Doxy 2/2 high risk sexual behavior. Last PSA 3.11 ng/mL 29 months ago.  Pt denies hematuria, weight loss, night sweats, fever, chills, malaise, perineal pain, testicular pain/swelling, or penile drainage. Says his father had prostatectomy due to prostate cancer.      Outpatient Medications Prior to Visit  Medication Sig Dispense Refill  . pentoxifylline (TRENTAL) 400 MG CR tablet Take 400 mg by mouth 3 (three) times daily with meals.  0  . terazosin (HYTRIN) 2 MG capsule Take 2 mg by mouth at bedtime.  5  . terbinafine (LAMISIL) 250 MG tablet Take 250 mg by mouth daily.  2  . celecoxib (CELEBREX) 100 MG capsule Take 1 capsule (100 mg total) by mouth 2 (two) times daily. (Patient not taking: Reported on 08/26/2017) 30 capsule 1  . cyclobenzaprine (FLEXERIL) 5 MG tablet Take 1 tablet (5 mg total) by mouth at bedtime as needed. 90 tablet 1   No facility-administered medications prior to visit.      ROS Review of Systems  Constitutional: Negative for chills, fever and malaise/fatigue.   Eyes: Negative for blurred vision.  Respiratory: Negative for shortness of breath.   Cardiovascular: Negative for chest pain and palpitations.  Gastrointestinal: Negative for abdominal pain and nausea.  Genitourinary: Positive for dysuria and frequency. Negative for hematuria and urgency.       Hesitancy  Musculoskeletal: Negative for joint pain and myalgias.  Skin: Negative for rash.  Neurological: Negative for tingling and headaches.  Psychiatric/Behavioral: Negative for depression. The patient is not nervous/anxious.     Objective:  Ht 5\' 5"  (1.651 m)   Wt 154 lb (69.9 kg)   BMI 25.63 kg/m   Vitals:   07/13/18 1015  BP: 107/70  Pulse: 64  Temp: 97.8 F (36.6 C)  TempSrc: Oral  SpO2: 98%  Weight: 154 lb (69.9 kg)  Height: 5\' 5"  (1.651 m)      Physical Exam  Constitutional: He is oriented to person, place, and time.  Well developed, well nourished, NAD, polite  HENT:  Head: Normocephalic and atraumatic.  Eyes: No scleral icterus.  Neck: Normal range of motion. Neck supple. No thyromegaly present.  Cardiovascular: Normal rate, regular rhythm and normal heart sounds.  Small varicosities noted on LLE  Pulmonary/Chest: Effort normal and breath sounds normal.  Abdominal: Soft. Bowel sounds are normal. There is no tenderness.  Genitourinary:  Genitourinary Comments: Prostate approximately 35 - 40 g, firm, tender, some loss of central fissure, no nodules. No testicular swelling/tenderness. No penile discharge. No genital lesions.  Musculoskeletal: He exhibits no edema.  Neurological: He is  alert and oriented to person, place, and time.  Skin: Skin is warm and dry. No rash noted. No erythema. No pallor.  Psychiatric: He has a normal mood and affect. His behavior is normal. Thought content normal.  Vitals reviewed.    Assessment & Plan:   1. Dysuria - Urinalysis Dipstick negative   2. Painful ejaculation - PSA  3. Prediabetes - Comprehensive metabolic panel -  HgB A1c 5.5% today.  4. Benign prostatic hyperplasia with urinary hesitancy - terazosin (HYTRIN) 2 MG capsule; Take 1 capsule (2 mg total) by mouth at bedtime.  Dispense: 30 capsule; Refill: 2  5. Need for prophylactic vaccination and inoculation against influenza - Flu Vaccine QUAD 6+ mos PF IM (Fluarix Quad PF)  6. High risk medication use - Comprehensive metabolic panel   Meds ordered this encounter  Medications  . terazosin (HYTRIN) 2 MG capsule    Sig: Take 1 capsule (2 mg total) by mouth at bedtime.    Dispense:  30 capsule    Refill:  2    Order Specific Question:   Supervising Provider    Answer:   Hoy Register [[id number]]    Follow-up: Return in about 4 weeks (around 08/10/2018) for BPH with LUTS.   Loletta Specter PA

## 2018-07-13 NOTE — Patient Instructions (Signed)
Hiperplasia prosttica benigna Benign Prostatic Hyperplasia La hiperplasia prosttica benigna (HBP) es un aumento del tamao de la prstata que es causado por el proceso de envejecimiento normal y no por el cncer. La prstata es una glndula del tamao de una nuez que participa en la produccin de semen. Est ubicada frente al recto y debajo de la vejiga. La vejiga almacena orina, y la uretra es el tubo que transporta la orina desde la vejiga hasta el exterior del cuerpo. La prstata puede aumentar de tamao a medida que un hombre envejece. Una prstata agrandada puede presionar la uretra. Esto puede dificultar el pasaje de la orina. La acumulacin de orina en la vejiga puede causar una infeccin. La presin y la infeccin pueden provocar daos en la vejiga y una insuficiencia en los riones (renal). Cules son las causas? Esta afeccin es una parte normal del proceso de envejecimiento. Sin embargo, no todos los hombres desarrollan problemas por esta afeccin. Si la prstata se agranda lejos de la uretra, el flujo de orina no se obstruir. Si se agranda hacia la uretra y la comprime, habr problemas con el paso de la orina. Qu incrementa el riesgo? Es ms probable que esta afeccin se manifieste en hombres mayores de 50aos. Cules son los signos o los sntomas? Los sntomas de esta afeccin incluyen los siguientes:  Levantarse con frecuencia durante la noche para orinar.  Necesidad de orinar con ms frecuencia durante el da.  Dificultad para comenzar a eliminar la orina.  Disminucin del tamao y de la fuerza del chorro de orina.  Prdida (goteo) luego de orinar.  Imposibilidad para orinar. En este caso, es necesario un tratamiento inmediato.  Imposibilidad para vaciar la vejiga completamente.  Dolor al orinar. Esto es ms comn si tambin hay una infeccin.  Infeccin urinaria (IU).  Cmo se diagnostica? Esta afeccin se diagnostica en funcin de los antecedentes mdicos, un  examen fsico y los sntomas. Tambin se le realizarn estudios, por ejemplo:  Un estudio despus de vaciar la vejiga. Este mide la cantidad de orina que queda en la vejiga despus de terminar de orinar.  Examen rectal digital. En un examen rectal, el mdico controla la prstata colocando un dedo lubricado y enguantado en el recto para sentir la parte posterior de la prstata. Este examen detecta el tamao de la glndula y si hay algn bulto o crecimiento anormal.  Anlisis de orina (urinlisis).  Estudio de antgeno prosttico especfico (PSA). Este es un anlisis de sangre que se utiliza para diagnosticar el cncer de prstata.  Una ecografa. En este estudio, se utilizan ondas de sonido para producir de manera electrnica una imagen de la glndula prosttica.  El mdico puede derivarlo a un especialista en enfermedades del rin y de la prstata (urlogo). Cmo se trata? Una vez que los sntomas comienzan, el mdico controlar la afeccin (vigilancia activa u observacin cautelosa). El tratamiento depender de la gravedad de la afeccin. El tratamiento puede incluir lo siguiente:  Observacin y exmenes anuales. Este puede ser el nico tratamiento necesario si la afeccin y los sntomas son leves.  Medicamentos para aliviar los sntomas, entre los que se incluyen los siguientes: ? Medicamentos para achicar la prstata. ? Medicamentos para relajar el msculo de la prstata.  Ciruga, solo cuando los casos son graves. La ciruga puede incluir lo siguiente: ? Prostatectoma. En este procedimiento, se extrae el tejido de la prstata completamente a travs de una incisin abierta, con un laparoscopio o con robtica. ? Reseccin transuretral de la prstata (  RTUP). En este procedimiento, se inserta una herramienta a travs de la abertura en la punta del pene (uretra). Se utiliza para cortar tejido del centro interior de la prstata. Los trozos se retiran a travs de la misma abertura del pene.  De este modo, se libera la obstruccin. ? Incisin transuretral (ITUP). En este procedimiento, se hacen pequeos cortes en la prstata. Esto alivia la presin de la prstata sobre la uretra. ? Termoterapia transuretral con microondas (TTUM). En este procedimiento, se utilizan microondas para generar calor. El calor destruye y extirpa una pequea cantidad de tejido prosttico. ? Ablacin transuretral con aguja (ATUA). En este procedimiento, se utiliza la radiofrecuencia para destruir y extirpar una pequea cantidad de tejido prosttico. ? Coagulacin intersticial con lser (CIL). En este procedimiento, se utiliza un lser para destruir y extirpar una pequea cantidad de tejido prosttico. ? Electrovaporizacin transuretral (EVTU). En este procedimiento, se utilizan electrodos para destruir y extirpar una pequea cantidad de tejido prosttico. ? Liberacin uretral prosttica. En este procedimiento, se inserta un implante para ejercer presin en los lbulos de la prstata, en direccin contraria a la uretra.  Siga estas indicaciones en su casa:  Tome los medicamentos de venta libre y los recetados solamente como se lo haya indicado el mdico.  Controle si hay cambios en los sntomas. Hable con su mdico antes de hacer cualquier cambio.  Evite beber grandes cantidades de lquido antes de irse a la cama o de salir de su casa.  Evite o reduzca la cantidad de cafena o alcohol que consume.  Tmese tiempo para orinar.  Concurra a todas las visitas de seguimiento como se lo haya indicado el mdico. Esto es importante. Comunquese con un mdico si:  Siente dolor en la espalda sin explicacin.  Los sntomas no mejoran con el tratamiento.  Los medicamentos le causan efectos secundarios.  La orina se vuelve muy oscura o tiene mal olor.  Siente que la parte inferior del abdomen est distendida y tiene problemas para orinar. Solicite ayuda de inmediato si:  Tiene fiebre o siente escalofros.  De  repente no puede orinar.  Se siente mareado, ligeramente aturdido o se desmaya.  Observa gran cantidad de sangre o cogulos sanguneos en la orina.  Sus problemas urinarios se vuelven difciles de controlar.  Siente dolor moderado a intenso en la espalda o en la fosa lumbar. La fosa lumbar es el costado del cuerpo entre las costillas y la cadera. Estos sntomas pueden representar un problema grave que constituye una emergencia. No espere hasta que los sntomas desaparezcan. Solicite atencin mdica de inmediato. Comunquese con el servicio de emergencias de su localidad (911 en los [place]). No conduzca por sus propios medios hasta el hospital. Resumen  La hiperplasia prosttica benigna (HBP) es un aumento del tamao de la prstata que es causado por el proceso de envejecimiento normal y no por el cncer.  Una prstata agrandada puede presionar la uretra. Esto puede dificultar el paso de la orina.  Esta afeccin es parte del proceso de envejecimiento normal y es ms probable que se manifieste en hombres mayores de 50aos.  Obtenga atencin de inmediato si de repente no puede orinar. Esta informacin no tiene como fin reemplazar el consejo del mdico. Asegrese de hacerle al mdico cualquier pregunta que tenga. Document Released: 10/14/2005 Document Revised: 02/03/2017 Document Reviewed: 02/03/2017 Elsevier Interactive Patient Education  2018 Elsevier Inc.  

## 2018-07-14 ENCOUNTER — Other Ambulatory Visit (INDEPENDENT_AMBULATORY_CARE_PROVIDER_SITE_OTHER): Payer: Self-pay | Admitting: Physician Assistant

## 2018-07-14 ENCOUNTER — Telehealth (INDEPENDENT_AMBULATORY_CARE_PROVIDER_SITE_OTHER): Payer: Self-pay

## 2018-07-14 DIAGNOSIS — R3911 Hesitancy of micturition: Principal | ICD-10-CM

## 2018-07-14 DIAGNOSIS — N401 Enlarged prostate with lower urinary tract symptoms: Secondary | ICD-10-CM

## 2018-07-14 LAB — COMPREHENSIVE METABOLIC PANEL
A/G RATIO: 1.7 (ref 1.2–2.2)
ALBUMIN: 4.3 g/dL (ref 3.5–5.5)
ALT: 26 IU/L (ref 0–44)
AST: 21 IU/L (ref 0–40)
Alkaline Phosphatase: 75 IU/L (ref 39–117)
BILIRUBIN TOTAL: 0.4 mg/dL (ref 0.0–1.2)
BUN/Creatinine Ratio: 28 — ABNORMAL HIGH (ref 9–20)
BUN: 23 mg/dL (ref 6–24)
CALCIUM: 9.3 mg/dL (ref 8.7–10.2)
CHLORIDE: 103 mmol/L (ref 96–106)
CO2: 22 mmol/L (ref 20–29)
Creatinine, Ser: 0.81 mg/dL (ref 0.76–1.27)
GFR calc non Af Amer: 104 mL/min/{1.73_m2} (ref >59)
GFR, EST AFRICAN AMERICAN: 120 mL/min/{1.73_m2} (ref >59)
GLOBULIN, TOTAL: 2.6 g/dL (ref 1.5–4.5)
Glucose: 117 mg/dL — ABNORMAL HIGH (ref 65–99)
POTASSIUM: 4.1 mmol/L (ref 3.5–5.2)
Sodium: 139 mmol/L (ref 134–144)
Total Protein: 6.9 g/dL (ref 6.0–8.5)

## 2018-07-14 LAB — PSA: Prostate Specific Ag, Serum: 1.9 ng/mL (ref 0.0–4.0)

## 2018-07-14 MED ORDER — FINASTERIDE 5 MG PO TABS: 5.0000 mg | ORAL_TABLET | Freq: Every day | ORAL | 1 refills | Status: DC

## 2018-07-14 NOTE — Telephone Encounter (Signed)
-----   Message from Loletta Specteroger David Gomez, PA-C sent at 07/14/2018 12:58 PM EDT ----- PSA is lower than previous. Does not seem he has cancer. He has a benign enlargement of the prostate. [facility] to [facility] to help shrink down the prostate. This pill may cause low libido. Also, takes approximately six months for prostate to shrink. Continue Terazosin.

## 2018-07-14 NOTE — Telephone Encounter (Signed)
Call placed using pacific interpreter Molly MaduroRobert [phone]) patient is aware that PSA is lower than before and it does not seem he has cancer. He does have a benign enlargement of the prostate; [facility]; it takes approximately six months for the prostate to shrink. Finasteride may cause low libido. Continue taking terazosin. CMA answered all patient questions. He expressed understanding but also stated he would speak with PCP about these results at his OV next month. Maryjean Mornempestt S Roberts, CMA

## 2018-08-11 ENCOUNTER — Encounter (INDEPENDENT_AMBULATORY_CARE_PROVIDER_SITE_OTHER): Payer: Self-pay | Admitting: Physician Assistant

## 2018-08-11 ENCOUNTER — Ambulatory Visit (INDEPENDENT_AMBULATORY_CARE_PROVIDER_SITE_OTHER): Payer: Self-pay | Admitting: Physician Assistant

## 2018-08-11 ENCOUNTER — Other Ambulatory Visit: Payer: Self-pay

## 2018-08-11 VITALS — BP 106/71 | HR 65 | Temp 97.9°F | Ht 65.0 in | Wt 158.2 lb

## 2018-08-11 DIAGNOSIS — N401 Enlarged prostate with lower urinary tract symptoms: Secondary | ICD-10-CM

## 2018-08-11 DIAGNOSIS — R3911 Hesitancy of micturition: Secondary | ICD-10-CM

## 2018-08-11 DIAGNOSIS — Z1211 Encounter for screening for malignant neoplasm of colon: Secondary | ICD-10-CM

## 2018-08-11 MED ORDER — TERAZOSIN HCL 2 MG PO CAPS: 2.0000 mg | ORAL_CAPSULE | Freq: Every day | ORAL | 1 refills | Status: AC

## 2018-08-11 MED ORDER — FINASTERIDE 5 MG PO TABS: 5.0000 mg | ORAL_TABLET | Freq: Every day | ORAL | 1 refills | Status: AC

## 2018-08-11 NOTE — Patient Instructions (Signed)
Recomendaciones dietticas para prevenir la formacin de clculos renales Dietary Guidelines to Help Prevent Kidney Stones Los clculos renales son depsitos de minerales y sales que se forman en el interior de los riones. Su riesgo de Warehouse manager clculos renales puede ser mayor en funcin de su dieta, el estilo de vida, los medicamentos que toma y la presencia de ciertas afecciones mdicas. Las instrucciones que se detallan a continuacin disminuyen las posibilidades de tener clculos renales para la mayora de las personas. Segn su salud general y el tipo de clculos renales que tienda a Environmental education officer, su nutricionista puede darle indicaciones ms especficas. Consejos para seguir Surveyor, minerals [place] de las etiquetas de los alimentos  Elija alimentos con etiquetas que digan "sin sal agregada" o "bajo contenido de sal". Limite el consumo de sodio a menos de 1500mg  por Futures trader.  Elija alimentos con calcio para incluirlos en cada comida o refrigerio. Trate de incorporar 300mg  de calcio en cada comida. Entre los [employer] contienen de 200 a 500mg  de calcio por porcin, se pueden incluir los siguientes: ? 8 onzas ( ) de [place], [place] no lctea fortificada y [place] de fruta fortificado. ? 8 onzas ( ) de Charity fundraiser, yogur y yogur de soja. ? 4 onzas ( ) de tofu. ? 1 onza de queso. ? 1 taza (300g) de higos secos. ? 1 taza (91g) de brcoli cocido. ? 1 lata de 1 a 3 onzas de sardinas o caballa.  La mayora de las personas necesitan de 1000 a 1500mg  de Forensic psychologist. Hable con su nutricionista sobre la cantidad de calcio que recomendara para usted. De compras  Compre mucha fruta y verdura fresca. La [employer] de las personas no Hotel manager las frutas y verduras, aun cuando contengan nutrientes que puedan contribuir a formar clculos renales.  Cuando compre alimentos semipreparados, elija los siguientes: ? Frutas enteras. ? Ensaladas preparadas con aderezo aparte. ? Batidos de fruta y Hydrologist.  Evite comprar comidas congeladas o fiambres.  Busque alimentos con cultivos vivos, como yogur y [place]. Coccin  No agregue sal a los alimentos cuando cocine. Ponga un salero en la mesa y deje que cada persona agregue sal a su gusto.  Para las pastas, guisos y sopas, use protenas vegetales, como frijoles, protena vegetal texturada (PVT) o tofu en lugar de carnes. Planificacin de las comidas  Si su nutricionista se lo indica, ingiera menos sal. Haga lo siguiente: ? Evite consumir alimentos prehechos o procesados. ? Evite las comidas rpidas.  Coma menos protenas animales, como queso, carne de [place], ave o pescado, si as se lo indica su nutricionista. Haga lo siguiente: ? Limite la cantidad de veces que ingiere carne de vaca, ave, pescado, o queso por semana. Siga una dieta sin carne vacuna, al menos 2 [employer]. ? Coma solo una porcin por da de carne de vaca, ave, pescado o mariscos. ? Cuando prepare protenas animales, corte los trozos en porciones pequeas. En trminos generales, una porcin de carne vacuna o de aves tiene casi el tamao de un mazo de cartas.  Coma al menos 5 porciones de frutas frescas y verduras Air cabin crew. Haga lo siguiente: ? Tenga a mano frutas y verduras para los refrigerios. ? Coma una fruta o un puado de frutos rojos con el desayuno. ? Coma una ensalada y fruta en el almuerzo. ? Incorpore dos clases de verduras en la cena.  Limite los alimentos con alto contenido de una sustancia llamada oxalato. Estos incluyen los siguientes: ? [place]. ? Ruibarbo. ? Remolachas. ?  Papas fritas y papas fritas de bolsa. ? Frutos secos.  Si toma diurticos regularmente, asegrese de comer al menos 1 o 2 frutas o verduras con alto contenido de [employer]. Estos incluyen los siguientes: ? [place]. ? Banana. ? Naranja, ciruela pasa, zanahoria, o jugo de tomate. ? Patata al horno. ? Repollo. ? Frijoles y arvejas partidas. Instrucciones  generales  Beba suficiente lquido para mantener la orina clara o de color amarillo plido. Esto es lo ms importante que Scientist, product/process development.  Hable con su mdico y su nutricionista sobre tomar suplementos diarios. En funcin de su salud y de la causa de sus clculos renales, usted puede recibir las siguientes recomendaciones: ? No tomar suplementos con vitaminaC. ? Tomar un suplemento de calcio. ? Tomar un suplemento probitico diario. ? Tomar otros suplementos como magnesio, aceite de pescado o vitamina B6.  Tome [employer] antibiticos y suplementos como se lo haya indicado el mdico.  Limite el consumo de alcohol a no ms de por da si es mujer y no est [place], y por da si es hombre. Una medida equivale a 12oz ( ) de cerveza, 5oz ( ) de vino o 1oz (44ml) de bebidas alcohlicas de alta graduacin.  Si su mdico se lo indica, baje de peso. Trabaje con su nutricionista para encontrar las estrategias y el plan de alimentacin que mejor funcione para usted. Qu alimentos no se recomiendan? Limite la ingesta de los siguientes alimentos, o segn se lo indique su nutricionista. Hable con su nutricionista sobre los alimentos especficos que debera evitar de acuerdo con el tipo de clculos renales que tiene y su estado de salud general. Cereales Panes. Rosquillas. Bollos. Panificados. Galletas saladas. Cereales. Pastas. Verduras [place]. Ruibarbo. Remolachas. Verduras enlatadas. Pepinillos. Aceitunas. Carnes y otros alimentos proteicos Frutos secos. Mantequilla de frutos secos. Porciones grandes de carne vacuna, de ave o de pescado. Embutidos y carnes saladas. Fiambres. Perros calientes. Salchichas. Lcteos Queso. Refrescos Refrescos regulares. Jugo de verduras regular. Condimentos y otros alimentos Mezclas de condimentos con sal. Condimentos para ensalada. Sopas enlatadas. Salsa de soja. [place]. Salsa barbacoa. Salsa en lata para pastas. Guisos. Pizza.  Lasaa. Comidas congeladas. Papas fritas en bolsa. Papas fritas. Resumen  Puede reducir el riesgo de tener clculos renales si introduce cambios en su dieta.  Lo ms importante es que beba mucho lquido. Debe beber suficiente cantidad de lquido para mantener la orina clara o de color amarillo plido.  Pregntele a su mdico o nutricionista qu cantidad de protenas de origen animal debera consumir cada da, y tambin qu cantidad de sal y calcio debe ingerir. Esta informacin no tiene Theme park manager el consejo del mdico. Asegrese de hacerle al mdico cualquier pregunta que tenga. Document Released: 07/08/2012 Document Revised: 02/03/2017 Document Reviewed: 02/03/2017 Elsevier Interactive Patient Education  2018 Elsevier Inc.     Hiperplasia prosttica benigna Benign Prostatic Hyperplasia La hiperplasia prosttica benigna (HBP) es un aumento del tamao de la prstata que es causado por el proceso de envejecimiento normal y no por Management consultant. La prstata es una glndula del tamao de una nuez que participa en la produccin de semen. Est ubicada frente al recto y debajo de la vejiga. La vejiga almacena orina, y la uretra es el tubo que transporta la orina desde la vejiga hasta el exterior del cuerpo. La prstata puede aumentar de tamao a medida que un hombre envejece. Una prstata agrandada puede presionar la uretra. Esto puede dificultar el pasaje de la orina. La acumulacin de [place] en  la vejiga puede causar una infeccin. La presin y la infeccin pueden provocar daos en la vejiga y una insuficiencia en los riones (renal). Cules son las causas? Esta afeccin es una parte normal del proceso de envejecimiento. Sin embargo, no todos los hombres desarrollan problemas por esta afeccin. Si la prstata se agranda lejos de la uretra, el flujo de orina no se obstruir. Si se agranda hacia la uretra y la comprime, habr problemas con el paso de la [place]. Qu incrementa el riesgo? Es ms  probable que esta afeccin se manifieste en hombres mayores de [id number]. Cules son los signos o los sntomas? Los sntomas de esta afeccin incluyen los siguientes:  Levantarse con frecuencia durante la noche para Geographical information systems officer.  Necesidad de Geographical information systems officer con ms frecuencia Administrator.  Dificultad para comenzar a eliminar la orina.  Disminucin del tamao y de la fuerza del chorro de [place].  Prdida (goteo) luego de Geographical information systems officer.  Imposibilidad para orinar. En este caso, es necesario un tratamiento inmediato.  Imposibilidad para vaciar la vejiga completamente.  Dolor al Beatrix Shipper. Esto es ms comn si tambin hay una infeccin.  Infeccin urinaria (IU).  Cmo se diagnostica? Esta afeccin se diagnostica en funcin de los antecedentes mdicos, un examen fsico y los sntomas. Tambin se le realizarn estudios, por ejemplo:  Un estudio despus de vaciar la vejiga. Este mide la cantidad de orina que queda en la vejiga despus de terminar de Geographical information systems officer.  Examen rectal digital. En un examen rectal, el mdico controla la prstata colocando un dedo lubricado y enguantado en el recto para sentir la parte posterior de la prstata. Este examen detecta el tamao de la glndula y si hay algn bulto o crecimiento anormal.  Anlisis de orina (urinlisis).  Estudio de antgeno prosttico especfico (PSA). Este es un anlisis de sangre que se [place] para Arts administrator de prstata.  Una ecografa. En [employer], se utilizan ondas de sonido para producir de Careers information officer una imagen de la glndula prosttica.  El mdico puede derivarlo a Music therapist en enfermedades del rin y de la prstata (urlogo). Cmo se trata? Una vez que los sntomas comienzan, el mdico controlar la afeccin (vigilancia activa u observacin cautelosa). El tratamiento depender de la gravedad de la afeccin. El tratamiento puede incluir lo siguiente:  Observacin y exmenes anuales. Este puede ser el nico tratamiento  necesario si la afeccin y los sntomas son leves.  Medicamentos para [employer], entre los que se incluyen los siguientes: ? Medicamentos para IT trainer. ? Medicamentos para relajar el msculo de la prstata.  Kandis Ban, solo [employer] son graves. La ciruga puede incluir lo siguiente: ? Prostatectoma. En este procedimiento, se extrae el tejido de la prstata completamente a travs de una incisin abierta, con un laparoscopio o con robtica. ? Reseccin transuretral de la prstata (RTUP). En este procedimiento, se inserta una herramienta a travs de la abertura en la punta del pene (uretra). Se utiliza para cortar tejido del centro interior de la prstata. Los trozos se retiran a Games developer de la misma abertura del pene. De este modo, se libera la obstruccin. ? Incisin transuretral (ITUP). En este procedimiento, se hacen pequeos cortes en la prstata. Esto alivia la presin de la prstata sobre la uretra. ? Termoterapia transuretral con microondas (TTUM). En este procedimiento, se utilizan microondas para Facilities manager. El calor destruye y extirpa Neomia Dear pequea cantidad de tejido prosttico. ? Ablacin transuretral con aguja (ATUA). En este procedimiento, se utiliza la radiofrecuencia para  destruir y extirpar una pequea cantidad de tejido prosttico. ? Coagulacin intersticial con lser (CIL). En este procedimiento, se utiliza un lser para destruir y extirpar una pequea cantidad de tejido prosttico. ? Electrovaporizacin transuretral (EVTU). En este procedimiento, se utilizan electrodos para destruir y extirpar una pequea cantidad de tejido prosttico. ? Liberacin uretral prosttica. En este procedimiento, se inserta un implante para ejercer presin en los lbulos de la prstata, en direccin contraria a la uretra.  Siga estas indicaciones en su casa:  Tome los medicamentos de venta libre y los recetados solamente como se lo haya indicado el mdico.  Controle si hay  cambios en los sntomas. Hable con su mdico antes de hacer cualquier cambio.  Evite beber grandes cantidades de lquido antes de irse a la cama o de salir de su casa.  Evite o reduzca la cantidad de cafena o alcohol que consume.  Tmese tiempo para orinar.  Concurra a todas las visitas de [address] se lo haya indicado el mdico. Esto es importante. Comunquese con un mdico si:  Siente dolor en la espalda sin explicacin.  Los sntomas no mejoran con Scientist, research (medical).  Los [employer] causan efectos secundarios.  La orina se vuelve muy oscura o tiene mal olor.  Siente que la parte inferior del abdomen est distendida y tiene problemas para Geographical information systems officer. Solicite ayuda de inmediato si:  Tiene fiebre o siente escalofros.  De repente no [employer].  Se siente mareado, ligeramente aturdido o se desmaya.  Observa gran cantidad de sangre o cogulos sanguneos en la orina.  Sus problemas urinarios se vuelven difciles de Chief Operating Officer.  Siente dolor moderado a intenso en la espalda o en la fosa lumbar. La fosa lumbar es el costado del cuerpo entre las costillas y la cadera. Estos sntomas pueden representar un problema grave que constituye Radio broadcast assistant. No espere hasta que los sntomas desaparezcan. Solicite atencin mdica de inmediato. Comunquese con el servicio de emergencias de su localidad (911 en los [place]). No conduzca por sus propios medios [employer] hospital. Resumen  La hiperplasia prosttica benigna (HBP) es un aumento del tamao de la prstata que es causado por el proceso de envejecimiento normal y no por Management consultant.  Una prstata agrandada puede presionar la uretra. Esto puede dificultar el paso de la [place].  Esta afeccin es parte del proceso de envejecimiento normal y es ms probable que se manifieste en hombres mayores de [id number].  Obtenga atencin de inmediato si de repente no puede orinar. Esta informacin no tiene Theme park manager el consejo  del mdico. Asegrese de hacerle al mdico cualquier pregunta que tenga. Document Released: 10/14/2005 Document Revised: 02/03/2017 Document Reviewed: 02/03/2017 Elsevier Interactive Patient Education  2018 [employer].

## 2018-08-11 NOTE — Progress Notes (Signed)
Subjective:  Patient ID: Wayne Michael, male    DOB: Jun 30, 1966  Age: 52 y.o. MRN: [id number]  CC: f/u BPH w/LUTS  HPI Wayne Michael is a 52 y.o. male with a medical history of LBP with left sided sciatica, prediabetes, prostatitis and high risk sexual behavior presents to f/u on BPH w/LUTS. Last PSA 1.9 ng/mL one month ago. Previous PSA 3.11 ng/mL 30 months ago. Had symptoms of dysuria, urinary hesitancy, and ejaculatory "burning" with a completely negative UA one month ago. Had already finished doxy and cipro empirically at the time of the urinalysis. Was prescribed terazosin and Finasteride which he says has helped with urinary symptoms. Says he  expelled sediment from the urine since his last visit. Brings samples of the sediment he had urinated. Would like analysis of the sediment but unable to pay for the testing. Currently endorses a vague discomfort in the RLQ of abdomen associated with pain he felt when he urinated the sediment. Does not endorse any other symptoms or complaints.      Outpatient Medications Prior to Visit  Medication Sig Dispense Refill  . finasteride (PROSCAR) 5 MG tablet Take 1 tablet (5 mg total) by mouth daily. 90 tablet 1  . terazosin (HYTRIN) 2 MG capsule Take 1 capsule (2 mg total) by mouth at bedtime. 30 capsule 2   No facility-administered medications prior to visit.      ROS Review of Systems  Constitutional: Negative for chills, fever and malaise/fatigue.  Eyes: Negative for blurred vision.  Respiratory: Negative for shortness of breath.   Cardiovascular: Negative for chest pain and palpitations.  Gastrointestinal: Negative for abdominal pain and nausea.  Genitourinary: Negative for dysuria and hematuria.  Musculoskeletal: Negative for joint pain and myalgias.  Skin: Negative for rash.  Neurological: Negative for tingling and headaches.  Psychiatric/Behavioral: Negative for depression. The patient is not nervous/anxious.      Objective:  BP 106/71 (BP Location: Left Arm, Patient Position: Sitting, Cuff Size: Normal)   Pulse 65   Temp 97.9 F (36.6 C) (Oral)   Ht 5\' 5"  (1.651 m)   Wt 158 lb 3.2 oz (71.8 kg)   SpO2 97%   BMI 26.33 kg/m   BP/Weight 08/11/2018 07/13/2018 08/26/2017  Systolic BP 106 107 100  Diastolic BP 71 70 70  Wt. (Lbs) 158.2 154 161.8  BMI 26.33 25.63 26.12      Physical Exam  Constitutional: He is oriented to person, place, and time.  Well developed, well nourished, NAD, polite  HENT:  Head: Normocephalic and atraumatic.  Eyes: No scleral icterus.  Neck: Normal range of motion. Neck supple. No thyromegaly present.  Cardiovascular: Normal rate, regular rhythm and normal heart sounds.  Pulmonary/Chest: Effort normal and breath sounds normal.  Abdominal: Soft. Bowel sounds are normal. There is no tenderness.  Musculoskeletal: He exhibits no edema.  Neurological: He is alert and oriented to person, place, and time.  Skin: Skin is warm and dry. No rash noted. No erythema. No pallor.  Psychiatric: He has a normal mood and affect. His behavior is normal. Thought content normal.  Vitals reviewed.    Assessment & Plan:   1. Benign prostatic hyperplasia with urinary hesitancy - Refill terazosin (HYTRIN) 2 MG capsule; Take 1 capsule (2 mg total) by mouth at bedtime.  Dispense: 90 capsule; Refill: 1 - Refill finasteride (PROSCAR) 5 MG tablet; Take 1 tablet (5 mg total) by mouth daily.  Dispense: 90 tablet; Refill: 1  2. Special screening for malignant neoplasms, colon -  Fecal occult blood, imunochemical   Meds ordered this encounter  Medications  . terazosin (HYTRIN) 2 MG capsule    Sig: Take 1 capsule (2 mg total) by mouth at bedtime.    Dispense:  90 capsule    Refill:  1    Order Specific Question:   Supervising Provider    Answer:   Hoy Register [[id number]]  . finasteride (PROSCAR) 5 MG tablet    Sig: Take 1 tablet (5 mg total) by mouth daily.    Dispense:  90 tablet     Refill:  1    Order Specific Question:   Supervising Provider    Answer:   Hoy Register [[id number]]    Follow-up: Return in about 5 months (around 01/10/2019).   Loletta Specter PA

## 2019-01-12 ENCOUNTER — Ambulatory Visit (INDEPENDENT_AMBULATORY_CARE_PROVIDER_SITE_OTHER): Payer: Self-pay

## 2019-02-02 ENCOUNTER — Ambulatory Visit: Payer: Self-pay | Admitting: Primary Care

## 2019-02-02 ENCOUNTER — Ambulatory Visit (INDEPENDENT_AMBULATORY_CARE_PROVIDER_SITE_OTHER): Payer: Self-pay | Admitting: Primary Care
